# Patient Record
Sex: Male | Born: 1955 | Race: White | Hispanic: No | Marital: Married | Smoking: Former smoker
Health system: Southern US, Community
[De-identification: ages and names within clinical notes are randomized; demographics above are authoritative.]

## PROBLEM LIST (undated history)

## (undated) DIAGNOSIS — I2699 Other pulmonary embolism without acute cor pulmonale: Secondary | ICD-10-CM

## (undated) DIAGNOSIS — J189 Pneumonia, unspecified organism: Secondary | ICD-10-CM

## (undated) DIAGNOSIS — E079 Disorder of thyroid, unspecified: Secondary | ICD-10-CM

## (undated) DIAGNOSIS — I10 Essential (primary) hypertension: Secondary | ICD-10-CM

## (undated) DIAGNOSIS — I82401 Acute embolism and thrombosis of unspecified deep veins of right lower extremity: Secondary | ICD-10-CM

## (undated) DIAGNOSIS — I4891 Unspecified atrial fibrillation: Secondary | ICD-10-CM

## (undated) DIAGNOSIS — I499 Cardiac arrhythmia, unspecified: Secondary | ICD-10-CM

## (undated) DIAGNOSIS — E039 Hypothyroidism, unspecified: Secondary | ICD-10-CM

## (undated) DIAGNOSIS — M1990 Unspecified osteoarthritis, unspecified site: Secondary | ICD-10-CM

## (undated) HISTORY — DX: Disorder of thyroid, unspecified: E07.9

## (undated) HISTORY — DX: Essential (primary) hypertension: I10

## (undated) HISTORY — PX: ANKLE FUSION: SHX881

## (undated) HISTORY — PX: PARTIAL HIP ARTHROPLASTY: SHX733

## (undated) HISTORY — PX: TOTAL HIP ARTHROPLASTY: SHX124

## (undated) HISTORY — PX: HERNIA REPAIR: SHX51

---

## 2004-12-19 ENCOUNTER — Inpatient Hospital Stay (HOSPITAL_COMMUNITY): Admission: RE | Admit: 2004-12-19 | Discharge: 2004-12-22 | Payer: Self-pay | Admitting: Orthopedic Surgery

## 2021-05-10 ENCOUNTER — Other Ambulatory Visit: Payer: Self-pay | Admitting: Surgery

## 2021-05-10 ENCOUNTER — Other Ambulatory Visit (HOSPITAL_COMMUNITY): Payer: Self-pay | Admitting: Surgery

## 2021-05-25 ENCOUNTER — Ambulatory Visit (HOSPITAL_COMMUNITY)
Admission: RE | Admit: 2021-05-25 | Discharge: 2021-05-25 | Disposition: A | Discharge status: Home / Self Care | Payer: 59 | Source: Ambulatory Visit | Attending: Surgery | Admitting: Surgery

## 2021-05-25 ENCOUNTER — Other Ambulatory Visit: Payer: Self-pay

## 2021-08-03 ENCOUNTER — Other Ambulatory Visit: Payer: Self-pay

## 2021-08-03 ENCOUNTER — Encounter: Payer: Self-pay | Admitting: Skilled Nursing Facility1

## 2021-08-03 ENCOUNTER — Encounter: Payer: 59 | Attending: Surgery | Admitting: Skilled Nursing Facility1

## 2021-08-03 DIAGNOSIS — E669 Obesity, unspecified: Secondary | ICD-10-CM | POA: Diagnosis present

## 2021-08-03 NOTE — Progress Notes (Signed)
Nutrition Assessment for Bariatric Surgery Medical Nutrition Therapy Appt Start Time: 9:55    End Time: 11:00  Patient was seen on 08/03/2021 for Pre-Operative Nutrition Assessment. Letter of approval faxed to [facility] bariatric surgery program coordinator on 08/03/2021.   Referral stated Supervised Weight Loss (SWL) visits needed: 0  Pt completed visits.   Pt has cleared nutrition requirements.   Planned surgery: sleeve gastrectomy  Pt expectation of surgery: to lose weight Pt expectation of dietitian: to help educate    NUTRITION ASSESSMENT   Anthropometrics  Start weight at [facility]: 330.5 lbs (date: 08/03/2021)  Height: 70 in BMI: 47.42 kg/m2     Clinical  Medical hx: HTN, thyroid Medications: see list  Labs: LDL 130, calcium 10.3 Notable signs/symptoms: aches and pains Any previous deficiencies? No  Micronutrient Nutrition Focused Physical Exam: Hair: No issues observed Eyes: No issues observed Mouth: No issues observed Neck: No issues observed Nails: No issues observed Skin: No issues observed  Lifestyle & Dietary Hx  Pt states he is excited to retire in the next 2 years.  Pt states he is in the middle of setting up a camping spot for his grandkids.  Pt states his wife makes the meals which are no healthy so he focuses on eating bars mostly: Dietitian educated pt on this topic.  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   24-Hr Dietary Recall First Meal 10am:  fruit bar and 2 halos Snack: energy bar Second Meal 2pm: tuna pack + crackers + energy bar Snack:  Third Meal 5:30-6: fruit bar  Snack:  Beverages: 80 ounces coffee, water 40-60 ounces, sweet tea   Estimated Energy Needs Calories: 1600   NUTRITION DIAGNOSIS  Overweight/obesity ([place]-3.3) related to past poor dietary habits and physical inactivity as evidenced by patient w/ planned sleeve gastrectomy surgery following dietary guidelines for continued  weight loss.    NUTRITION INTERVENTION  Nutrition counseling (C-1) and education (E-2) to facilitate bariatric surgery goals.  Educated pt on micronutrient deficiencies post surgery and strategies to mitigate that risk   Pre-Op Goals Reviewed with the Patient Track food and beverage intake (pen and paper, MyFitness Pal, Baritastic app, etc.) Make healthy food choices while monitoring portion sizes Consume 3 meals per day or try to eat every 3-5 hours Avoid concentrated sugars and fried foods Keep sugar & fat in the single digits per serving on food labels Practice CHEWING your food (aim for applesauce consistency) Practice not drinking 15 minutes before, during, and 30 minutes after each meal and snack Avoid all carbonated beverages (ex: soda, sparkling beverages)  Limit caffeinated beverages (ex: coffee, tea, energy drinks) Avoid all sugar-sweetened beverages (ex: regular soda, sports drinks)  Avoid alcohol  Aim for 64-100 ounces of FLUID daily (with at least half of fluid intake being plain water)  Aim for at least 60-80 grams of PROTEIN daily Look for a liquid protein source that contains ?15 g protein and ?5 g carbohydrate (ex: shakes, drinks, shots) Make a list of non-food related activities Physical activity is an important part of a healthy lifestyle so keep it moving! The goal is to reach 150 minutes of exercise per week, including cardiovascular and weight baring activity.  *Goals that are bolded indicate the pt would like to start working towards these  Handouts Provided Include  Bariatric Surgery handouts (Nutrition Visits, Pre-Op Goals, Protein Shakes, Vitamins & Minerals)  Learning Style & Readiness for Change Teaching method utilized: Visual &  Auditory  Demonstrated degree of understanding via: Teach Back  Readiness Level: action Barriers to learning/adherence to lifestyle change: none identified      MONITORING & EVALUATION Dietary intake, weekly physical  activity, body weight, and pre-op goals reached at next nutrition visit.    Next Steps  Patient is to follow up at [facility] for Pre-Op Class >2 weeks before surgery for further nutrition education.  Pt has completed visits. No further supervised visits required/recomended

## 2021-09-09 ENCOUNTER — Ambulatory Visit (INDEPENDENT_AMBULATORY_CARE_PROVIDER_SITE_OTHER): Payer: 59 | Admitting: Psychology

## 2021-09-09 DIAGNOSIS — F509 Eating disorder, unspecified: Secondary | ICD-10-CM

## 2021-09-09 NOTE — Progress Notes (Addendum)
Date of Evaluation: 09/09/2021 Time Seen: 2pm Duration: 60 minutes Type of Session: Inittial Appointment for Evaluation  Location of Patient: Home (private location) Location of Provider: At home in a private office due to COVID-19 pandemic Type of Contact: Caregility video visit with audio  Prior to proceeding with today's appointment, two pieces of identifying information were obtained from [place] to verify identity. In addition, Emidio's physical location at the time of this appointment was obtained. In the event of technical difficulties, Sigmond shared a phone number they could be reached at. [place] and this provider participated in today's telepsychological service. Emre denied anyone else being present in the room or on the virtual appointment.The provider's role was explained to [place]. The provider reviewed and discussed issues of confidentiality, privacy, and limits therein (e.g., reporting obligations). In addition to verbal informed consent, written informed consent for psychological services was obtained from [place] prior to the initial intake interview. Written consent included information concerning the practice, financial arrangements, and confidentiality and patients' rights. Since the clinic is not a 24/7 crisis center, mental health emergency resources were shared, and the provider explained MyChart, e-mail, voicemail, and/or other messaging systems should be utilized only for non-emergency reasons. This provider also explained that information obtained during appointments will be placed in their electronic medical chart in a confidential manner. Rondy verbally acknowledged understanding of the aforementioned and agreed to use mental health emergency resources discussed if needed. Moreover, Asiel agreed information may be shared with other [place] [facility] providers as needed for coordination of care. By signing the new patient documents, Mcgwire provided written consent for  coordination of care. Benson verbally acknowledged understanding he is ultimately responsible for understanding his insurance benefits as it relates to reimbursement of telepsychological and in-person services. This provider also reviewed confidentiality, as it relates to telepsychological services, as well as the rationale for telepsychological services. More specifically, this provider's clinic is providing telepsychological services to reduce exposure to COVID-19. The patient expressed understanding regarding the rationale for telepsychological services. Kalen verbally consented to proceed.  Coyle completed the psychiatric diagnostic evaluation (complete history, including past, family, and social history, psychiatric history, and establishment of a tentative diagnosis) and the bariatric assessment for a total of 60 minutes. Code [phone] was billed.   Additionally, during this initial appointment this provider completed the scoring of the following measures: Beck Anxiety Inventory (BAI) (5 minutes), Becks Depression Index (5 minutes), Eating Disorder Diagnostic Scale (EDDS) (10 minutes [administration and scoring]), Mini Mental Status Examination (MMSE) (10 minutes [administration and scoring]), & Mood Disorder Questionnaire (MDQ) (5 minutes [administration and scoring]). A total of 35 minutes was spent on the scoring of the aforementioned measures and code [id number] was billed.   Mental Status Examination:  Appearance:  neat Behavior: appropriate to circumstances Mood: euthymic Affect: mood congruent Speech: WNL Eye Contact: appropriate Psychomotor Activity: WNL Thought Process: linear, logical, and goal directed and denies suicidal, homicidal, and self-harm ideation, plan and intent Content/Perceptual Disturbances: none Orientation: AAOx4 Cognition/Sensorium: intact Insight: good Judgment: good  Time Requirements: Interview: 60 minutes (billing code [id number]) Assessment scoring and interpreting:  35 minutes (billing code [id number])  DSM-5 Diagnosis(es) Code: E66.01 Morbid Obesity  Plan: Donavyn is currently scheduled for a feedback appointment on 09/23/2021 at 2pm via [place] video visit with audio.      Bariatric Evaluation    CONFIDENTIAL    Client Name: Jetson Pickrel  MRN: [id number] Date of Birth: [birth year]                                                 Date of Evaluation: 09/09/2021 Total Assessment Time: 60 Minutes                                          Date of Report: 09/09/2021 Evaluator: Clarice Pole, Psy.D.                                 Referring Physician: Dr. Romana Juniper, [facility]  Reason for Referral: Mr. Elizabeth Haff reported he was referred for an evaluation to determine if he is mentally fit for [bariatric] surgery. Per referral paperwork, he has been struggling [with obesity] essentially his entire adult life, has had limited and transient success with multiple attempts at weight loss, is interested in sleeve gastrectomy in order to afford aggressive treatment of his obesity, and is an excellent candidate for sleeve gastrectomy.   Sources of Information Clinical Interview Bariatric Questionnaire [place] Interview for Gastric Bypass Beck Anxiety Inventory (BAI) Beck Depression Inventory, 2nd Edition (BDI-II) Eating Disorder Diagnostic Scale (EDDS) Mini-Mental State Examination (MMSE)  Mood Disorder Questionnaire (MDQ) Review of Medical Record (provided by [facility])  Patient Identification and Chief Complaint: Mr. Grantham currently resides in [place], [place], noting he lives with his wife whom he gets along with well. He stated he completed high school and a four-year program. He denied a history of learning diagnosis or grade retentions. Mr. Hohmann reported he is currently employed with [employer], which he enjoys.   Mr. Gassmann discussed a belief weight loss may help with increasing energy and flexibility,  feel[ing] better, and reducing use of medication. He shared his social support system consists of his wife and children. Mr. Doescher reported a belief there would be a positive impact on his relationships if he were to lose weight, adding it would better allow him to play with his grandchildren. He described his wife as the primary cook in the house.    Mr. Kosh and referral paperwork reported his medical history is significant for the following: arthritis and DVT. Mr. Coffin and referral paperwork noted his surgical history is significant for inguinal hernia, broken hip, hip replacement, and ankle fusion. His family medical history is significant for stroke, hypertension, and coronary artery disease. Mr. Saldivar reported he is medication compliant and does not have any issues with his current medications.   Current Medications: Amlodipine Valsartan 5-357m Gabapentin 300MG Levothyroxine 200MCG Meloxicam 7.5MG   Mr. CPanebiancodescribed his mental health history as remarkable. He denied ever utilizing mental health services; psychotropic medication; or meeting full criteria for a depressive episode, anxiety disorder, and panic attack. He also denied ever experiencing psychiatric hospitalization; abuse or neglect; or suicidal ideation, plan, or intent. He reported use of one standard size drink of alcohol a week and having ceased use of tobacco 31 years ago. He denied use of any other recreational and illicit substances and expressed confidence in his ability to cease alcohol and maintain his cessation of tobacco use. He denied awareness of any familial history of psychiatric concerns or substance abuse.  Patient's Understanding of the Procedure/Risks of Surgery: Mr. Mcclenathan reported having a friend and a neighbor that previously underwent bariatric surgery, noting both were successful. He denied having any concerns about information he has received from them. He shared he is pursuing a gastric sleevectomy,  stating it involves putting three holes in [his] belly and surgically removing a part of the stomach and leaving a two-ounce pouch stomach. He described awareness of anesthesia being administered during the procedure and noted the surgery can cause death, blood clots, trouble healing, infection, diarrhea, and nutritional deficiencies. He stated there would be a postoperative hospital stay and he would be unable to return to work/daily activities for two-to-three weeks. He reported his primary motivations for the procedure include increased day-to-day mobility, improved health, enhanced relationship with his wife. He expressed an expectation to lose 100 pounds following the surgery, noting maximum weight loss could take one-to-two years.  Mr. Mccready reported confidence in his ability to implement food restrictions following gastric surgery, noting he has been drinking protein shakes, taking multivitamins, researching the procedure and required dietary changes, following [facility]' dietary recommendations, increasing his water intake from 60oz to 70oz daily, using a diet tracker application, eating more consistently throughout the day, using a Fitbit to increase his walking to 10,000 steps a day, reducing his use of coffee, and has plans to replace sweet tea with unsweet tea. He expressed confidence in his ability to obtain and prepare food. He reported a belief his living environment would assist him in his attempts to control his eating. Following surgery, he reported awareness he will be primarily consuming liquids and portion sizes will be small. When able to start eating solid foods, he indicated he would have to consume lean meats (e.g., chicken), vegetables, and fruits. Mr. Niu expressed awareness he would have to avoid or limit his use of coffee, carbonated beverages, spicy foods, sugary foods and drinks, and red meat. Optimally, he indicated he would consume at least three small meals a day, chew his  food carefully, and not drink prior to meals and wait after meals to drink, and sip versus gulp beverages.   History of Weight Gains and Losses: Mr. Winterton described being fairly active, noting he is often building or making things. He described his past weight loss efforts as including an unspecified Diet Center in the 1980s (helpful), local diet food service with B12 (helpful), and Weight Watchers (helpful) in the 1980s and 1990s. He noted use of a Keto diet in recent years (not helpful) and current use of nutritional services (helpful).  Eating Behaviors: Mr. Howatt denied a history of binge- and purge-related behaviors. He reported taking multivitamins and minerals daily and drinking at least 60oz of water daily.   Current Diet and Plans for the Future: Breakfast Eggs and two slices of bread.  Lunch Tuna or sardines with crackers.   Dinner Ribs, casserole, or chicken.   Snacks If anything, potato chips.    Mental Status Examination: Mr. Merolla presented on time to the session and completed all the necessary paperwork. He was dressed casually, and his hygiene was observed to be good. Mr. Labrada was oriented to person, place, time, and purpose of appointment. His attitude was cooperative and cheerful. There were no unusual psychomotor movements or changes. Speech patterns were normal in rate, tone, volume, and without pressure. Affect was reactive and mood congruent. Thought processes were goal-directed and logical. Insight and judgement were good. The Mini-Mental State Examination (MMSE) was administered. He scored a 20/21 (  patient was experiencing technical issues that prevented administration of tasks with visual components), which is indicative of normal cognitive functioning.    Psychological Functioning: Mr. Rosenbloom completed the Mood Disorder Questionnaire (MDQ). He scored a 0/13 and denied several of the endorsed symptoms have occurred during the same period or have caused any  problems, which is a negative screening for bipolar-related disorder. On the [place] (BAI), Mr. Carlis Abbott scored a 5/63, which is indicative of minimal anxiety symptomatology. On the Beck Depression Inventory (BDI-II), Mr. Tritschler scored an 8/63, which is indicative of minimal depression symptomatology. The Eating Disorder Diagnostic Scale (EDDS) was administered. Mr. Guardado indicated his weight and shape have impacted how he views himself as a person. He denied experiencing any problematic eating behaviors.    Conclusions & Recommendations: Mr. Aric Jost is a 66 year old male who was referred to the [place] division by Dr. Romana Juniper at [facility] for a psychological evaluation to determine his suitability for bariatric surgery.   Regarding bariatric surgery, Mr. Karner appears to be highly motivated and has a good understanding of the bariatric surgery as well as risks and lifestyle changes needed to promote post-surgical weight loss success. Results of this evaluation yielded use of one standard drink of alcohol a week and occasional inappropriate food choices. He described awareness of the importance of ceasing his use of alcohol and following all dietary requirements, discussing confidence in his ability to do so and various successful efforts he has made. At this time, Mr. Bures appears to be able to make an informed decision about the surgery he is contemplating. He appears to be motivated and expressed understanding of the post-surgical requirements. If Mr. Claggett surgery is scheduled more than three months from today's date (09/09/2021), he is required to schedule a follow-up visit for this examiner to briefly re-evaluate his psychological status at that time. This follow-up visit should occur within two months of his scheduled surgery date.  The following recommendations are offered to promote Mr. Lautner health and well-being:  It is  recommended Mr. Etheredge participate in educational sessions regarding a healthy diet and post-operative meal planning with a dietician or other health care providers.   Re-evaluation. If Mr. Senner surgery is scheduled more than three months from his date of approval, he is required to contact our office [phone]) for a brief check-in appointment within two months from his surgery date.   Nutritional Counseling. Mr. Chesnut is strongly encouraged to continue attending nutritional counseling appointments in order to plan a healthy diet and post-operative meals. He is encouraged to make recommended changes to his diet prior to surgery in order to increase the chances of continuing a healthy diet after surgery.   Exercise. Mr. Qualley is encouraged to participate in educational sessions on exercise that will be appropriate for his medical conditions and support his weight loss plans in a safe and healthy manner. Specifically, he is encouraged to consider participating in the Bariatric Exercise and Lifestyle Transformation (BELT) program, a partnership between [place]. There, he will join fellow [facility] bariatric patients three times a week for personalized aerobic and strength training instruction, as well as educational sessions on diet, exercise and behavior modification strategies. BELT meets at [place] at [employer].  LegalPaid.ch  Support groups. Mr. Slone is encouraged to join a support group to give him encouragement as he faces the psychological adjustments of bariatric surgery and the need to significantly  adjust one's meals and food choices. A list of support groups offered through [facility] can be found through the bariatrics department website: MetroMeds.nl  Self-help resources.  To develop strategies for managing  emotional difficulties encountered before and after weight loss surgery, the patient is encouraged to read The Emotional First + Aid Kit: A Practical Guide to Life After Bariatric Surgery, Second Edition by Caren Griffins L. Sheppard Coil, PsyD. Examples of strategies discussed in this book include relieving stress without using food, developing and maintaining an exercise program, preventing relapse, etc. Mr. Suydam is strongly encouraged to practice mindful eating, the goal of which is to pay close attention to the smell, sight, taste, temperature, texture, etc. of food. Eating mindfully helps to eat slower while enjoying food more fully. Useful books on mindful eating include Savor: Mindful Eating, Mindful Life and How to Eat, both by mindfulness expert Thich Nhat Hanh.  Mental health treatment. For additional support either prior to or after the surgery, Mr. Ahonen may consider seeking the care of a therapist (counselor, psychologist) in order to develop skills for coping with the adjustment to a new lifestyle. Available therapists include other clinicians within the [place] [phone]). He may also seek other in-network providers in the community by searching online at DustingSprays.pl.   General recommendations for bariatric surgery: Replace the habit of late-night snacking with something else (e.g., chewing gum, drinking water, or a relaxing activity like reading or crosswords that occupies your hands) and consider going to bed earlier.  Practice eating 4-6 meals per day. Each meal should last about 20 minutes. Practice drinking liquids 30 minutes before or after meals. Keep a food diary for 1 week. Record all foods eaten during the day, including snacks and drinks. Be very specific and very honest.  Get into the habit of reading food labels to evaluate content of protein, sugars, carbohydrates, sodium, etc.  Continue to eat lots of vegetables.  Prepare meals at home,  rather than take-out or fast food.  Take multivitamins including zinc and iron.  Develop exercise plans, including a low-impact and safe exercise plan to start 4-5 weeks into recovery, and a more intensive exercise plan for later.  Determine who will take care of any major responsibilities (particularly those involving physical activity, such as childcare) in the early stages of your recovery.  Educate family and friends who will be involved in your recovery about the extent and importance of your new lifestyle changes. The more they know, the better they can support you and help you stay on track!               Dolores Lory, PsyD

## 2021-09-23 ENCOUNTER — Ambulatory Visit (INDEPENDENT_AMBULATORY_CARE_PROVIDER_SITE_OTHER): Payer: 59 | Admitting: Psychology

## 2021-09-23 NOTE — Progress Notes (Signed)
Date of Appointment: 09/23/2021  Time Seen: 2pm Duration: 30 minutes (20 minutes feedback and 10 minutes report writing) Type of Session: Follow-up Appointment for Evaluation  Location of Patient: Home (private location) Location of Provider: At home in a private office due to COVID-19 pandemic Type of Contact: Caregility video visit with audio  Ethaniel participated in the session via Caregility video visit with audio, from the privacy of their home due to the COVID-19 pandemic. Danzel provided verbal consent to proceed with today's appointment. This provider participated from a private home office. Today's interactive feedback session was completed which included reviewing the following measures: Beck Anxiety Inventory (BAI), Becks Depression Index (BDI), Eating Disorder Diagnostic Scale (EDDS), Mental Status Examination (MSE), & Mood Disorder Questionnaire (MDQ). During this interactive feedback session, this provider and Corrigan discussed the results of the aforementioned measures, treatment recommendations, and the final determination regarding the psychological approval for bariatric surgery.   Please see the bariatric assessment, under documents, for additional details. This provider completed the written report which includes integration of patient data, interpretation of standardized test results, interpretation of clinical data, review of referral from surgeon & clinical decision making (105 minutes in total).  The interactive feedback session was completed today and a total of 30 minutes was spent on feedback and report writing. No billing code for the feedback appointment.   Mental Status Examination:  Appearance:  neat Behavior: appropriate to circumstances Mood: euthymic Affect: mood congruent Speech: WNL Eye Contact: appropriate Psychomotor Activity: WNL Thought Process: linear, logical, and goal directed and denies suicidal, homicidal, and self-harm ideation, plan and  intent Content/Perceptual Disturbances: none Orientation: AAOx4 Cognition/Sensorium: intact Insight: good Judgment: good  Time Requirements: Feedback: 30 total minutes (no billing code) Report writing: 105 minutes (billing codes 29798 and [phone])  DSM-5 Diagnosis(es) code: E66.01 Morbid Obesity  Plan: Aikeem provided verbal consent for his evaluation to be sent to [facility]. No further follow-up planned by this provider.                    Margarite Gouge, PsyD

## 2021-09-29 ENCOUNTER — Other Ambulatory Visit: Payer: Self-pay

## 2021-09-29 ENCOUNTER — Encounter: Payer: 59 | Attending: Surgery | Admitting: Skilled Nursing Facility1

## 2021-09-29 DIAGNOSIS — E669 Obesity, unspecified: Secondary | ICD-10-CM | POA: Diagnosis not present

## 2021-09-29 NOTE — Progress Notes (Signed)
Supervised Weight Loss Visit Bariatric Nutrition Education  1 out of 2 SWL Appointments   Sleeve gastrectomy   NUTRITION ASSESSMENT  Anthropometrics  Start weight at [facility]: 330.5 lbs (date: 08/03/2022) Today's weight: 327 lbs BMI: 46.92 kg/m2    Clinical  Medical hx: HTN, thyroid Medications: see list  Labs: LDL 130, calcium 10.3 Notable signs/symptoms: aches and pains Any previous deficiencies? No  Lifestyle & Dietary Hx  Pt states he has been Walking 4000-10000 steps per day.  Pt states he has been taking the bariatric multivitamin: advised to keep them but stop taking them until his surgery.   Pt states he needs to build a a fence and a bridge for some goats he owns 19 acres Pt states raw vegetables set his wife's crohns off so he does not eat non starchy vegetables.   Estimated daily fluid intake: 50-60 oz Supplements: bariatric multivitamin and calcium Current average weekly physical activity: working his 19 acres  24-Hr Dietary Recall First Meal: coffee + whey protein or yogurt Snack: 2 oranges  Second Meal: 2 slices pizza or energy bar Snack: crackers + sardines  Third Meal: 3 slices pizza  Snack:  Beverages: 3 cups of coffee, juice, water  Estimated Energy Needs Calories: 1800   NUTRITION DIAGNOSIS  Overweight/obesity ([place]-3.3) related to past poor dietary habits and physical inactivity as evidenced by patient w/ planned sleeve gastrectomy surgery following dietary guidelines for continued weight loss.   NUTRITION INTERVENTION  Nutrition counseling (C-1) and education (E-2) to facilitate bariatric surgery goals.  Pre-Op Goals Progress & New Goals NEW: add in non starchy vegetables   Handouts Provided Include    Learning Style & Readiness for Change Teaching method utilized: Visual & Auditory  Demonstrated degree of understanding via: Teach Back  Readiness Level: action Barriers to learning/adherence to lifestyle change: none identified   RD's  Notes for next Visit  Assess pts adherence to chosen goals   MONITORING & EVALUATION Dietary intake, weekly physical activity, body weight, and pre-op goals in 1 month.   Next Steps  Patient is to return to [facility] for next SWL

## 2021-10-17 ENCOUNTER — Encounter: Payer: 59 | Attending: Surgery | Admitting: Skilled Nursing Facility1

## 2021-10-17 ENCOUNTER — Other Ambulatory Visit: Payer: Self-pay

## 2021-10-17 DIAGNOSIS — E669 Obesity, unspecified: Secondary | ICD-10-CM | POA: Diagnosis not present

## 2021-10-17 NOTE — Progress Notes (Signed)
Supervised Weight Loss Visit ?Bariatric Nutrition Education ? ?2 out of 3 SWL Appointments  ? ?Sleeve gastrectomy  ? ?NUTRITION ASSESSMENT ? ?Anthropometrics  ?Start weight at [facility]: 330.5 lbs (date: 08/03/2022) ?Today's weight: 329 lbs ?BMI: 47.21 kg/m2   ? ?Clinical  ?Medical hx: HTN, thyroid ?Medications: see list  ?Labs: LDL 130, calcium 10.3 ?Notable signs/symptoms: aches and pains ?Any previous deficiencies? No ? ?Lifestyle & Dietary Hx ? ? ?Pt states he talked to his insurance provider which dictates he needs 3 SWL including his assessment and states today is his last visit: Dietitian advised pt this office only knows of the information sent over from the surgeons office.  ?Pt continued to vent about the money he has put into the program and does not wish to have to pay these fees again: Dietitian advised pt this conversation is outside of the scope of this office; pt states he is going to call his surgeons office ? ?Pt states he continues to log his foods. ? ?Estimated daily fluid intake: 50-60 oz ?Supplements: bariatric multivitamin and calcium ?Current average weekly physical activity: working his 19 acres ? ?24-Hr Dietary Recall ?First Meal: coffee + whey protein or yogurt or 2 oranges + yogurt + cup ?Snack: 2 oranges  ?Second Meal: 2 slices pizza or energy bar or energy bar + saltines + baked beans  ?Snack: crackers + sardines  ?Third Meal: 3 slices pizza or 2 pizza or chips and hamburger ?Snack:  ?Beverages: 3 cups of coffee, water ? ?Estimated Energy Needs ?Calories: 1800 ? ? ?NUTRITION DIAGNOSIS  ?Overweight/obesity ([place]-3.3) related to past poor dietary habits and physical inactivity as evidenced by patient w/ planned sleeve gastrectomy surgery following dietary guidelines for continued weight loss. ? ? ?NUTRITION INTERVENTION  ?Nutrition counseling (C-1) and education (E-2) to facilitate bariatric surgery goals. ? ?Pre-Op Goals Progress & New Goals ?continue: add in non starchy vegetables  ?NEW: avoid  drinking with meals  ? ?Handouts Provided Include  ? ? ?Learning Style & Readiness for Change ?Teaching method utilized: Visual & Auditory  ?Demonstrated degree of understanding via: Teach Back  ?Readiness Level: action ?Barriers to learning/adherence to lifestyle change: none identified  ? ?RD's Notes for next Visit  ?Assess pts adherence to chosen goals ? ? ?MONITORING & EVALUATION ?Dietary intake, weekly physical activity, body weight, and pre-op goals  ? ?Next Steps  ?Patient is to return to [facility] for next SWL ?

## 2021-10-27 ENCOUNTER — Ambulatory Visit: Payer: 59 | Admitting: Skilled Nursing Facility1

## 2021-10-31 ENCOUNTER — Ambulatory Visit: Payer: Self-pay | Admitting: Surgery

## 2021-10-31 NOTE — H&P (View-Only) (Signed)
?Brett Gibbs ?[id number] ?Referring Provider: [facility] * ? ?Subjective  ? ?Chief Complaint: New Consultation (Return Weight Loss - Sleeve - surgery date 11/28/2021) ? ? ?History of Present Illness: ? ?Returns for follow-up regarding surgical management of morbid obesity. He has completed the bariatric pathway with no barriers identified. His upper GI and chest x-ray were negative for mild esophageal fold thickening. He has been cleared by the dietitian and psychologist. His labs were unremarkable. He has continue to work diligently to increase his activity level and maintain a healthy eating pattern. ? ?Initial visit 05/06/21: This is a very pleasant 66 year old man who presents for consultation regarding surgical management of morbid obesity. He has been struggling with this for essentially his entire adult life, and has had limited and transient success with multiple attempts at weight loss through diet and exercise. He had COVID in the last couple of years and after that it seemed that his weight gain worsened and he has had increasing difficulty with weight loss. He also struggles with arthritic pain in his ankle, now his hip and shoulder, which he notes is aggravated by his weight. He is interested in sleeve gastrectomy in order to afford aggressive treatment of his obesity, to improve the quality and quantity of his life, as he has 3 grandchildren that he is wanting to spend time with and be active with. 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 He reports that at least on workdays, his eating habits are fairly well-balanced. ?He denies any significant cardiac or pulmonary history, but notes that he did develop the perioperative DVT after his ankle surgery and was on a limited course of Eliquis. That surgery was also complicated by postoperative UTI and  pneumonia. Reports history of hypertension and hypothyroid which are well managed. States that there is a condition in his family of the name of which he does not recall, but his 52-year-old grandson is 36 pounds and is followed by provider at [facility]. ?Previous abdominal surgery includes inguinal hernia in his 63s ? ?Review of Systems: ?A complete review of systems was obtained from the patient. I have reviewed this information and discussed as appropriate with the patient. See HPI as well for other ROS. ? ?Medical History: ?Past Medical History:  ?Diagnosis Date  ? Arthritis  ? DVT (deep venous thrombosis) (CMS-HCC)  ? ?Patient Active Problem List  ?Diagnosis  ? Acute right ankle pain  ? Osteoarthritis of right subtalar joint  ? Post-traumatic arthritis of right ankle  ? Primary osteoarthritis of right ankle  ? Pulmonary embolism, bilateral (CMS-HCC)  ? ?Past Surgical History:  ?Procedure Laterality Date  ? FUSION ARTHRODESIS ANKLE W/ARTHROSCOPY  ? Partial Hip Surgery  ? ? ?Allergies  ?Allergen Reactions  ? Penicillins Hives and Itching  ? ?Current Outpatient Medications on File Prior to Visit  ?Medication Sig Dispense Refill  ? amlodipine-valsartan (EXFORGE) 5-320 mg per tablet Please hold until PCP follow-up. Previous dose take one tablet by mouth daily.  ? gabapentin (NEURONTIN) 300 MG capsule 1 (ONE) CAPSULE EVERY 8 HOURS AS NEEDED FOR PAIN/SCIATICA  ? levothyroxine (SYNTHROID) 200 MCG tablet Take 200 mcg by mouth once daily  ? meloxicam (MOBIC) 7.5 MG tablet TAKE 1 TABLET BY MOUTH TWICE DAILY AS NEEDED FOR JOINT PAIN.  ? multivitamin tablet Take 1 tablet by mouth once  daily  ? ?No current facility-administered medications on file prior to visit.  ? ?Family History  ?Problem Relation Age of Onset  ? Stroke Mother  ? High blood pressure (Hypertension) Mother  ? High blood pressure (Hypertension) Father  ? Coronary Artery Disease (Blocked arteries around heart) Father  ? ? ?Social History  ? ?Tobacco Use  ?Smoking  Status Never  ?Smokeless Tobacco Never  ? ? ?Social History  ? ?Socioeconomic History  ? Marital status: Married  ?Tobacco Use  ? Smoking status: Never  ? Smokeless tobacco: Never  ?Vaping Use  ? Vaping Use: Unknown  ?Substance and Sexual Activity  ? Alcohol use: Defer  ? Drug use: Defer  ? ?Objective:  ? ?Vitals:  ?10/27/21 1103  ?BP: (!) 140/80  ?Pulse: 88  ?Temp: 36.3 ?C (97.4 ?F)  ?SpO2: 96%  ?Weight: (!) 148.5 kg (327 lb 6.4 oz)  ?Height: 177.8 cm (5\' 10" )  ? ?Body mass index is 46.98 kg/m?. ? ?Alert, well-appearing ?Unlabored respirations ? ?Assessment and Plan:  ?Diagnoses and all orders for this visit: ? ?Morbid obesity (CMS-HCC) ? ? ? ?He remains an excellent candidate for sleeve gastrectomy. We previously discussed the surgery including technical aspects, the risks of bleeding, infection, pain, scarring, injury to intra-abdominal structures, staple line leak or abscess, chronic abdominal pain or nausea, new onset or worsened GERD, DVT/PE, pneumonia, heart attack, stroke, death, failure to reach weight loss goals and weight regain, hernia. Discussed again the typical peri-, and postoperative course. Discussed the importance of lifelong behavioral changes to combat the chronic and relapsing disease which is obesity. He had several insightful questions which were answered to his satisfaction. His goal is to lose at least 100 pounds and to reach a weight of 200 pounds or less. We will proceed with sleeve gastrectomy as scheduled next month. ? ?Jerri Hargadon Raquel James, MD  ?

## 2021-10-31 NOTE — H&P (Signed)
?Brett Gibbs ?[id number]  ? ?Referring Provider: P.A., [facility] * ? ?Subjective  ? ?Chief Complaint: New Consultation (Return Weight Loss - Sleeve - surgery date 11/28/2021) ? ? ?History of Present Illness: ? ?Returns for follow-up regarding surgical management of morbid obesity. He has completed the bariatric pathway with no barriers identified. His upper GI and chest x-ray were negative for mild esophageal fold thickening. He has been cleared by the dietitian and psychologist. His labs were unremarkable. He has continue to work diligently to increase his activity level and maintain a healthy eating pattern. ? ?Initial visit 05/06/21: This is a very pleasant 66-year-old man who presents for consultation regarding surgical management of morbid obesity. He has been struggling with this for essentially his entire adult life, and has had limited and transient success with multiple attempts at weight loss through diet and exercise. He had COVID in the last couple of years and after that it seemed that his weight gain worsened and he has had increasing difficulty with weight loss. He also struggles with arthritic pain in his ankle, now his hip and shoulder, which he notes is aggravated by his weight. He is interested in sleeve gastrectomy in order to afford aggressive treatment of his obesity, to improve the quality and quantity of his life, as he has 3 grandchildren that he is wanting to spend time with and be active with. 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 He reports that at least on workdays, his eating habits are fairly well-balanced. ?He denies any significant cardiac or pulmonary history, but notes that he did develop the perioperative DVT after his ankle surgery and was on a limited course of Eliquis. That surgery was also complicated by postoperative UTI and  pneumonia. Reports history of hypertension and hypothyroid which are well managed. States that there is a condition in his family of the name of which he does not recall, but his 3-year-old grandson is 90 pounds and is followed by provider at [facility]. ?Previous abdominal surgery includes inguinal hernia in his 20s ? ?Review of Systems: ?A complete review of systems was obtained from the patient. I have reviewed this information and discussed as appropriate with the patient. See HPI as well for other ROS. ? ?Medical History: ?Past Medical History:  ?Diagnosis Date  ? Arthritis  ? DVT (deep venous thrombosis) (CMS-HCC)  ? ?Patient Active Problem List  ?Diagnosis  ? Acute right ankle pain  ? Osteoarthritis of right subtalar joint  ? Post-traumatic arthritis of right ankle  ? Primary osteoarthritis of right ankle  ? Pulmonary embolism, bilateral (CMS-HCC)  ? ?Past Surgical History:  ?Procedure Laterality Date  ? FUSION ARTHRODESIS ANKLE W/ARTHROSCOPY  ? Partial Hip Surgery  ? ? ?Allergies  ?Allergen Reactions  ? Penicillins Hives and Itching  ? ?Current Outpatient Medications on File Prior to Visit  ?Medication Sig Dispense Refill  ? amlodipine-valsartan (EXFORGE) 5-320 mg per tablet Please hold until PCP follow-up. Previous dose take one tablet by mouth daily.  ? gabapentin (NEURONTIN) 300 MG capsule 1 (ONE) CAPSULE EVERY 8 HOURS AS NEEDED FOR PAIN/SCIATICA  ? levothyroxine (SYNTHROID) 200 MCG tablet Take 200 mcg by mouth once daily  ? meloxicam (MOBIC) 7.5 MG tablet TAKE 1 TABLET BY MOUTH TWICE DAILY AS NEEDED FOR JOINT PAIN.  ? multivitamin tablet Take 1 tablet by mouth once   daily  ? ?No current facility-administered medications on file prior to visit.  ? ?Family History  ?Problem Relation Age of Onset  ? Stroke Mother  ? High blood pressure (Hypertension) Mother  ? High blood pressure (Hypertension) Father  ? Coronary Artery Disease (Blocked arteries around heart) Father  ? ? ?Social History  ? ?Tobacco Use  ?Smoking  Status Never  ?Smokeless Tobacco Never  ? ? ?Social History  ? ?Socioeconomic History  ? Marital status: Married  ?Tobacco Use  ? Smoking status: Never  ? Smokeless tobacco: Never  ?Vaping Use  ? Vaping Use: Unknown  ?Substance and Sexual Activity  ? Alcohol use: Defer  ? Drug use: Defer  ? ?Objective:  ? ?Vitals:  ?10/27/21 1103  ?BP: (!) 140/80  ?Pulse: 88  ?Temp: 36.3 ?C (97.4 ?F)  ?SpO2: 96%  ?Weight: (!) 148.5 kg (327 lb 6.4 oz)  ?Height: 177.8 cm (5' 10")  ? ?Body mass index is 46.98 kg/m?. ? ?Alert, well-appearing ?Unlabored respirations ? ?Assessment and Plan:  ?Diagnoses and all orders for this visit: ? ?Morbid obesity (CMS-HCC) ? ? ? ?He remains an excellent candidate for sleeve gastrectomy. We previously discussed the surgery including technical aspects, the risks of bleeding, infection, pain, scarring, injury to intra-abdominal structures, staple line leak or abscess, chronic abdominal pain or nausea, new onset or worsened GERD, DVT/PE, pneumonia, heart attack, stroke, death, failure to reach weight loss goals and weight regain, hernia. Discussed again the typical peri-, and postoperative course. Discussed the importance of lifelong behavioral changes to combat the chronic and relapsing disease which is obesity. He had several insightful questions which were answered to his satisfaction. His goal is to lose at least 100 pounds and to reach a weight of 200 pounds or less. We will proceed with sleeve gastrectomy as scheduled next month. ? ?Anjenette Gerbino AMANDA Navy Belay, MD  ?

## 2021-11-14 ENCOUNTER — Ambulatory Visit: Payer: 59 | Admitting: Skilled Nursing Facility1

## 2021-11-14 ENCOUNTER — Encounter: Payer: 59 | Attending: Surgery | Admitting: Skilled Nursing Facility1

## 2021-11-14 DIAGNOSIS — E669 Obesity, unspecified: Secondary | ICD-10-CM | POA: Diagnosis present

## 2021-11-14 DIAGNOSIS — Z713 Dietary counseling and surveillance: Secondary | ICD-10-CM | POA: Diagnosis not present

## 2021-11-14 NOTE — Progress Notes (Signed)
Pre-Operative Nutrition Class:   ? ?Patient was seen on 11/14/2021 for Pre-Operative Bariatric Surgery Education at [facility]   ? ?Surgery date: 11/28/2021 ?Surgery type: Sleeve ?Start weight at [facility]: 330.5 ?Weight today: 331.5 ? ?Samples given per MNT protocol. Patient educated on appropriate usage: ?Bariatric Advantage Multivitamin ?Lot #[id number] ?Exp:10/23 ?  ?Bariatric Advantage Calcium  ?Lot #[id number] ?Exp:06/19/2022 ?  ?Protein Shake Ensure Max ?Lot # [id number] ?Exp: 09/14/2022 ?  ?Bariatric Advantage Protein Powder ?Lot # [id number] ?Exp: 02/24 ? ?The following the learning objectives were met by the patient during this course: ?Identify Pre-Op Dietary Goals and will begin 2 weeks pre-operatively ?Identify appropriate sources of fluids and proteins  ?State protein recommendations and appropriate sources pre and post-operatively ?Identify Post-Operative Dietary Goals and will follow for 2 weeks post-operatively ?Identify appropriate multivitamin and calcium sources ?Describe the need for physical activity post-operatively and will follow MD recommendations ?State when to call healthcare provider regarding medication questions or post-operative complications ?When having a diagnosis of diabetes understanding hypoglycemia symptoms and the inclusion of 1 complex carbohydrate per meal ? ?Handouts given during class include: ?Pre-Op Bariatric Surgery Diet Handout ?Protein Shake Handout ?Post-Op Bariatric Surgery Nutrition Handout ?[facility] Program Information Flyer ?Support Group Information Flyer ?[facility] Bariatric Supplements Price List ? ?Follow-Up Plan: ?Patient will follow-up at [facility] 2 weeks post operatively for diet advancement per MD.  ?

## 2021-11-14 NOTE — Patient Instructions (Signed)
DUE TO COVID-19 ONLY ONE VISITOR  (aged 66 and older)  IS ALLOWED TO COME WITH YOU AND STAY IN THE WAITING ROOM ONLY DURING PRE OP AND PROCEDURE.   ?**NO VISITORS ARE ALLOWED IN THE SHORT STAY AREA OR RECOVERY ROOM!!** ? ?IF YOU WILL BE ADMITTED INTO THE HOSPITAL YOU ARE ALLOWED ONLY TWO SUPPORT PEOPLE DURING VISITATION HOURS ONLY (7 AM -8PM)   ?The support person(s) must pass our screening, gel in and out, and wear a mask at all times, including in the patient?s room. ?Patients must also wear a mask when staff or their support person are in the room. ?Visitors GUEST BADGE MUST BE WORN VISIBLY  ?One adult visitor may remain with you overnight and MUST be in the room by 8 P.M. ?  ? ? Your procedure is scheduled on: 11/28/21 ? ? [facility] ? ?  Report to admitting at : 8:45 AM ? ? Call this number if you have problems the morning of surgery [phone] ? ? Do not eat food :After Midnight. ? ? After Midnight you may have the following liquids until : 8:00 AM  DAY OF SURGERY ? ?MORNING OF SURGERY DRINK:   DRINK 1 G2 drink BEFORE YOU LEAVE HOME (At 8:00 AM), DRINK ALL OF THE  G2 DRINK AT ONE TIME.   ?NO SOLID FOOD AFTER 600 PM THE NIGHT BEFORE YOUR SURGERY. YOU MAY DRINK CLEAR FLUIDS. THE G2 DRINK YOU DRINK BEFORE YOU LEAVE HOME WILL BE THE LAST FLUIDS YOU DRINK BEFORE SURGERY. ? ?PAIN IS EXPECTED AFTER SURGERY AND WILL NOT BE COMPLETELY ELIMINATED. AMBULATION AND TYLENOL WILL HELP REDUCE INCISIONAL AND GAS PAIN. MOVEMENT IS KEY! ? ?YOU ARE EXPECTED TO BE OUT OF BED WITHIN 4 HOURS OF ADMISSION TO YOUR PATIENT ROOM. ? ?SITTING IN THE RECLINER THROUGHOUT THE DAY IS IMPORTANT FOR DRINKING FLUIDS AND MOVING GAS THROUGHOUT THE GI TRACT. ? ?COMPRESSION STOCKINGS SHOULD BE [facility]N [facility] ST[facility] UNLESS YOU ARE WALKING.  ? ?INCENTIVE SPIROMETER SHOULD BE USED EVERY HOUR WHILE AWAKE TO DECREASE POST-OPERATIVE COMPLICATIONS SUCH AS PNEUMONIA. ? ?WHEN DISCHARGED HOME, IT IS IMPORTANT TO  CONTINUE TO WALK EVERY HOUR AND USE THE INCENTIVE SPIROMETER EVERY HOUR.  ? ?Water ?Black Coffee (sugar ok, NO MILK/CREAM OR CREAMERS)  ?Tea (sugar ok, NO MILK/CREAM OR CREAMERS) regular and decaf                             ?Plain Jell-O (NO RED)                                           ?Fruit ices (not with fruit pulp, NO RED)                                     ?Popsicles (NO RED)                                                                  ?Juice: apple, WHITE grape, WHITE cranberry ?Sports drinks like Gatorade (  NO RED) ?Clear broth(vegetable,chicken,beef) ?  ?  ?The day of surgery:  ?Drink ONE (1) Pre-Surgery Clear Ensure or G2 at AM the morning of surgery. Drink in one sitting. Do not sip.  ?This drink was given to you during your hospital  ?pre-op appointment visit. ?Nothing else to drink after completing the  ?Pre-Surgery Clear Ensure or G2. ?  ?       If you have questions, please contact your surgeon?s office.  ?  ?Oral Hygiene is also important to reduce your risk of infection.                                    ?Remember - BRUSH YOUR TEETH THE MORNING OF SURGERY WITH YOUR REGULAR TOOTHPASTE ? ? Do NOT smoke after Midnight ? ? Take these medicines the morning of surgery with A SIP OF WATER:  ? ?DO NOT TAKE ANY ORAL DIABETIC MEDICATIONS DAY OF YOUR SURGERY ? ?Bring CPAP mask and tubing day of surgery. ?                  ?           You may not have any metal on your body including hair pins, jewelry, and body piercing ? ?           Do not wear lotions, powders, perfumes/cologne, or deodorant ? ?            Men may shave face and neck. ? ? Do not bring valuables to the hospital. [facility] IS NOT ?            RESPONSIBLE   FOR VALUABLES. ? ? Contacts, dentures or bridgework may not be worn into surgery. ? ? Bring small overnight bag day of surgery. ?  ? Patients discharged on the day of surgery will not be allowed to drive home.  Someone NEEDS to stay with you for the first 24 hours after  anesthesia. ? ? Special Instructions: Bring a copy of your healthcare power of attorney and living will documents         the day of surgery if you haven't scanned them before. ? ?            Please read over the following fact sheets you were given: IF YOU HAVE QUESTIONS ABOUT YOUR PRE-OP INSTRUCTIONS PLEASE CALL [phone] ? ?   [facility] - Preparing for Surgery ?Before surgery, you can play an important role.  Because skin is not sterile, your skin needs to be as free of germs as possible.  You can reduce the number of germs on your skin by washing with CHG (chlorahexidine gluconate) soap before surgery.  CHG is an antiseptic cleaner which kills germs and bonds with the skin to continue killing germs even after washing. ?Please DO NOT use if you have an allergy to CHG or antibacterial soaps.  If your skin becomes reddened/irritated stop using the CHG and inform your nurse when you arrive at Short Stay. ?Do not shave (including legs and underarms) for at least 48 hours prior to the first CHG shower.  You may shave your face/neck. ?Please follow these instructions carefully: ? 1.  Shower with CHG Soap the night before surgery and the  morning of Surgery. ? 2.  If you choose to wash your hair, wash your hair first as usual with your  normal  shampoo. ? 3.  After you shampoo, rinse your hair and body thoroughly to remove the  shampoo.                           4.  Use CHG as you would any other liquid soap.  You can apply chg directly  to the skin and wash  ?                     Gently with a scrungie or clean washcloth. ? 5.  Apply the CHG Soap to your body ONLY FROM THE NECK DOWN.   Do not use on face/ open      ?                     Wound or open sores. Avoid contact with eyes, ears mouth and genitals (private parts).  ?                     Engineering geologist,  Genitals (private parts) with your normal soap. ?            6.  Wash thoroughly, paying special attention to the area where your surgery  will be  performed. ? 7.  Thoroughly rinse your body with warm water from the neck down. ? 8.  DO NOT shower/wash with your normal soap after using and rinsing off  the CHG Soap. ?               9.  Pat yourself dry with a clean towel. ?           10.  Wear clean pajamas. ?           11.  Place clean sheets on your bed the night of your first shower and do not  sleep with pets. ?Day of Surgery : ?Do not apply any lotions/deodorants the morning of surgery.  Please wear clean clothes to the hospital/surgery center. ? ?FAILURE TO FOLLOW THESE INSTRUCTIONS MAY RESULT IN THE CANCELLATION OF YOUR SURGERY ?PATIENT SIGNATURE_________________________________ ? ?NURSE SIGNATURE__________________________________ ? ?________________________________________________________________________  ? ?

## 2021-11-15 ENCOUNTER — Encounter (HOSPITAL_COMMUNITY): Payer: Self-pay

## 2021-11-15 ENCOUNTER — Encounter (HOSPITAL_COMMUNITY)
Admission: RE | Admit: 2021-11-15 | Discharge: 2021-11-15 | Disposition: A | Discharge status: Home / Self Care | Payer: 59 | Source: Ambulatory Visit | Attending: Surgery | Admitting: Surgery

## 2021-11-15 ENCOUNTER — Other Ambulatory Visit: Payer: Self-pay

## 2021-11-15 DIAGNOSIS — Z01812 Encounter for preprocedural laboratory examination: Secondary | ICD-10-CM | POA: Diagnosis present

## 2021-11-15 DIAGNOSIS — Z6841 Body Mass Index (BMI) 40.0 and over, adult: Secondary | ICD-10-CM | POA: Diagnosis not present

## 2021-11-15 HISTORY — DX: Unspecified osteoarthritis, unspecified site: M19.90

## 2021-11-15 HISTORY — DX: Unspecified atrial fibrillation: I48.91

## 2021-11-15 HISTORY — DX: Cardiac arrhythmia, unspecified: I49.9

## 2021-11-15 HISTORY — DX: Other pulmonary embolism without acute cor pulmonale: I26.99

## 2021-11-15 HISTORY — DX: Acute embolism and thrombosis of unspecified deep veins of right lower extremity: I82.401

## 2021-11-15 HISTORY — DX: Hypothyroidism, unspecified: E03.9

## 2021-11-15 HISTORY — DX: Pneumonia, unspecified organism: J18.9

## 2021-11-15 LAB — CBC WITH DIFFERENTIAL/PLATELET
Abs Immature Granulocytes: 0.01 10*3/uL (ref 0.00–0.07)
Basophils Absolute: 0.1 10*3/uL (ref 0.0–0.1)
Basophils Relative: 2 %
Eosinophils Absolute: 0.4 10*3/uL (ref 0.0–0.5)
Eosinophils Relative: 6 %
HCT: 45.2 % (ref 39.0–52.0)
Hemoglobin: 15.1 g/dL (ref 13.0–17.0)
Immature Granulocytes: 0 %
Lymphocytes Relative: 33 %
Lymphs Abs: 1.9 10*3/uL (ref 0.7–4.0)
MCH: 30.8 pg (ref 26.0–34.0)
MCHC: 33.4 g/dL (ref 30.0–36.0)
MCV: 92.2 fL (ref 80.0–100.0)
Monocytes Absolute: 0.4 10*3/uL (ref 0.1–1.0)
Monocytes Relative: 8 %
Neutro Abs: 3 10*3/uL (ref 1.7–7.7)
Neutrophils Relative %: 51 %
Platelets: 182 10*3/uL (ref 150–400)
RBC: 4.9 MIL/uL (ref 4.22–5.81)
RDW: 13 % (ref 11.5–15.5)
WBC: 5.7 10*3/uL (ref 4.0–10.5)
nRBC: 0 % (ref 0.0–0.2)

## 2021-11-15 LAB — COMPREHENSIVE METABOLIC PANEL
ALT: 30 U/L (ref 0–44)
AST: 23 U/L (ref 15–41)
Albumin: 3.9 g/dL (ref 3.5–5.0)
Alkaline Phosphatase: 54 U/L (ref 38–126)
Anion gap: 7 (ref 5–15)
BUN: 18 mg/dL (ref 8–23)
CO2: 29 mmol/L (ref 22–32)
Calcium: 10.4 mg/dL — ABNORMAL HIGH (ref 8.9–10.3)
Chloride: 104 mmol/L (ref 98–111)
Creatinine, Ser: 0.86 mg/dL (ref 0.61–1.24)
GFR, Estimated: >60 mL/min (ref >60)
Glucose, Bld: 110 mg/dL — ABNORMAL HIGH (ref 70–99)
Potassium: 4.6 mmol/L (ref 3.5–5.1)
Sodium: 140 mmol/L (ref 135–145)
Total Bilirubin: 0.8 mg/dL (ref 0.3–1.2)
Total Protein: 7 g/dL (ref 6.5–8.1)

## 2021-11-15 NOTE — Progress Notes (Addendum)
For Short Stay: ?[place] appointment date: ?Date of COVID positive in last 90 days: ?COVID Vaccine: Pfizer x 2: 04/30/20 ?Bowel Prep reminder: N/A ? ? ?For Anesthesia: ?PCP - Dr. Bertram Millard ?Cardiologist -  ? ?Chest x-ray - 05/25/21 ?EKG - 05/25/21 ?Stress Test -  ?ECHO -  ?Cardiac Cath -  ?Pacemaker/ICD device last checked: ?Pacemaker orders received: ?Device Rep notified: ? ?Spinal Cord Stimulator: ? ?Sleep Study -  ?CPAP -  ? ?Fasting Blood Sugar -  ?Checks Blood Sugar _____ times a day ?Date and result of last Hgb A1c- ? ?Blood Thinner Instructions: ?Aspirin Instructions: ?Last Dose: ? ?Activity level: Can go up a flight of stairs and activities of daily living without stopping and without chest pain and/or shortness of breath ?  Able to exercise without chest pain and/or shortness of breath ?  Unable to go up a flight of stairs without chest pain and/or shortness of breath ?   ? ?Anesthesia review: Hx: PE,blood clot,Afib ? ?Patient denies shortness of breath, fever, cough and chest pain at PAT appointment ? ? ?Patient verbalized understanding of instructions that were given to them at the PAT appointment. Patient was also instructed that they will need to review over the PAT instructions again at home before surgery.  ?

## 2021-11-15 NOTE — Progress Notes (Signed)
?   11/15/21 0853  ?OBSTRUCTIVE SLEEP APNEA  ?Have you ever been diagnosed with sleep apnea through a sleep study? No  ?Do you snore loudly (loud enough to be heard through closed doors)?  1  ?Do you often feel tired, fatigued, or sleepy during the daytime (such as falling asleep during driving or talking to someone)? 0  ?Has anyone observed you stop breathing during your sleep? 1  ?Do you have, or are you being treated for high blood pressure? 1  ?BMI more than 35 kg/m2? 1  ?Age > 50 (1-yes) 1  ?Neck circumference greater than:Male 16 inches or larger, Male 17inches or larger? 1  ?Male Gender (Yes=1) 1  ?Obstructive Sleep Apnea Score 7  ? ? ?

## 2021-11-15 NOTE — Progress Notes (Deleted)
?   11/15/21 1057  ?OBSTRUCTIVE SLEEP APNEA  ?Score 5 or greater  Results sent to PCP  ? ? ?

## 2021-11-16 LAB — TYPE AND SCREEN
ABO/RH(D): A NEG
Antibody Screen: NEGATIVE

## 2021-11-25 NOTE — Progress Notes (Signed)
Called patient and spoke with his wife, Brett Gibbs. She is instructed for her husband to arrive at 1130 for 1400 surgery on 11/28/21. To complete ERAS drink by 1100. She makes notes on her phone about these time changes and reads it back to Clinical research associate. ?

## 2021-11-28 ENCOUNTER — Inpatient Hospital Stay (HOSPITAL_COMMUNITY)
Admission: RE | Admit: 2021-11-28 | Discharge: 2021-11-30 | DRG: 620 | Disposition: A | Discharge status: Home / Self Care | Payer: 59 | Attending: Surgery | Admitting: Surgery

## 2021-11-28 ENCOUNTER — Encounter (HOSPITAL_COMMUNITY)
Admission: RE | Disposition: A | Discharge status: Home / Self Care | Payer: Self-pay | Source: Home / Self Care | Attending: Surgery

## 2021-11-28 ENCOUNTER — Inpatient Hospital Stay (HOSPITAL_COMMUNITY): Payer: 59 | Admitting: Anesthesiology

## 2021-11-28 ENCOUNTER — Inpatient Hospital Stay (HOSPITAL_COMMUNITY): Payer: 59 | Admitting: Physician Assistant

## 2021-11-28 ENCOUNTER — Other Ambulatory Visit: Payer: Self-pay

## 2021-11-28 ENCOUNTER — Encounter (HOSPITAL_COMMUNITY): Payer: Self-pay | Admitting: Surgery

## 2021-11-28 DIAGNOSIS — M19019 Primary osteoarthritis, unspecified shoulder: Secondary | ICD-10-CM | POA: Diagnosis present

## 2021-11-28 DIAGNOSIS — Z79899 Other long term (current) drug therapy: Secondary | ICD-10-CM

## 2021-11-28 DIAGNOSIS — Z6841 Body Mass Index (BMI) 40.0 and over, adult: Secondary | ICD-10-CM | POA: Diagnosis not present

## 2021-11-28 DIAGNOSIS — E875 Hyperkalemia: Secondary | ICD-10-CM | POA: Diagnosis present

## 2021-11-28 DIAGNOSIS — Z87891 Personal history of nicotine dependence: Secondary | ICD-10-CM | POA: Diagnosis not present

## 2021-11-28 DIAGNOSIS — Z8701 Personal history of pneumonia (recurrent): Secondary | ICD-10-CM | POA: Diagnosis not present

## 2021-11-28 DIAGNOSIS — Z981 Arthrodesis status: Secondary | ICD-10-CM

## 2021-11-28 DIAGNOSIS — I10 Essential (primary) hypertension: Secondary | ICD-10-CM

## 2021-11-28 DIAGNOSIS — Z8616 Personal history of COVID-19: Secondary | ICD-10-CM | POA: Diagnosis not present

## 2021-11-28 DIAGNOSIS — Z79890 Hormone replacement therapy (postmenopausal): Secondary | ICD-10-CM | POA: Diagnosis not present

## 2021-11-28 DIAGNOSIS — N179 Acute kidney failure, unspecified: Secondary | ICD-10-CM | POA: Diagnosis not present

## 2021-11-28 DIAGNOSIS — E039 Hypothyroidism, unspecified: Secondary | ICD-10-CM | POA: Diagnosis present

## 2021-11-28 DIAGNOSIS — Z86711 Personal history of pulmonary embolism: Secondary | ICD-10-CM

## 2021-11-28 DIAGNOSIS — Z87440 Personal history of urinary (tract) infections: Secondary | ICD-10-CM

## 2021-11-28 DIAGNOSIS — M169 Osteoarthritis of hip, unspecified: Secondary | ICD-10-CM | POA: Diagnosis present

## 2021-11-28 DIAGNOSIS — Z880 Allergy status to penicillin: Secondary | ICD-10-CM

## 2021-11-28 DIAGNOSIS — Z791 Long term (current) use of non-steroidal anti-inflammatories (NSAID): Secondary | ICD-10-CM | POA: Diagnosis not present

## 2021-11-28 DIAGNOSIS — M19171 Post-traumatic osteoarthritis, right ankle and foot: Secondary | ICD-10-CM | POA: Diagnosis present

## 2021-11-28 DIAGNOSIS — Z8249 Family history of ischemic heart disease and other diseases of the circulatory system: Secondary | ICD-10-CM

## 2021-11-28 HISTORY — PX: UPPER GI ENDOSCOPY: SHX6162

## 2021-11-28 HISTORY — PX: LAPAROSCOPIC GASTRIC SLEEVE RESECTION: SHX5895

## 2021-11-28 LAB — CBC WITH DIFFERENTIAL/PLATELET
Abs Immature Granulocytes: 0.03 10*3/uL (ref 0.00–0.07)
Basophils Absolute: 0 10*3/uL (ref 0.0–0.1)
Basophils Relative: 0 %
Eosinophils Absolute: 0 10*3/uL (ref 0.0–0.5)
Eosinophils Relative: 0 %
HCT: 46.8 % (ref 39.0–52.0)
Hemoglobin: 15.8 g/dL (ref 13.0–17.0)
Immature Granulocytes: 0 %
Lymphocytes Relative: 6 %
Lymphs Abs: 0.7 10*3/uL (ref 0.7–4.0)
MCH: 31.6 pg (ref 26.0–34.0)
MCHC: 33.8 g/dL (ref 30.0–36.0)
MCV: 93.6 fL (ref 80.0–100.0)
Monocytes Absolute: 0.1 10*3/uL (ref 0.1–1.0)
Monocytes Relative: 1 %
Neutro Abs: 10.7 10*3/uL — ABNORMAL HIGH (ref 1.7–7.7)
Neutrophils Relative %: 93 %
Platelets: 157 10*3/uL (ref 150–400)
RBC: 5 MIL/uL (ref 4.22–5.81)
RDW: 13.2 % (ref 11.5–15.5)
WBC: 11.7 10*3/uL — ABNORMAL HIGH (ref 4.0–10.5)
nRBC: 0 % (ref 0.0–0.2)

## 2021-11-28 LAB — ABO/RH: ABO/RH(D): A NEG

## 2021-11-28 LAB — TROPONIN I (HIGH SENSITIVITY): Troponin I (High Sensitivity): 4 ng/L (ref <18)

## 2021-11-28 SURGERY — GASTRECTOMY, SLEEVE, LAPAROSCOPIC
Anesthesia: General

## 2021-11-28 MED ORDER — FENTANYL CITRATE PF 50 MCG/ML IJ SOSY
PREFILLED_SYRINGE | INTRAMUSCULAR | Status: AC
  Filled 2021-11-28: qty 1

## 2021-11-28 MED ORDER — OXYCODONE HCL 5 MG PO TABS: 5.0000 mg | ORAL_TABLET | Freq: Once | ORAL | Status: AC | PRN

## 2021-11-28 MED ORDER — LEVOTHYROXINE SODIUM 100 MCG PO TABS
200.0000 ug | ORAL_TABLET | Freq: Every day | ORAL | Status: DC
  Administered 2021-11-29 – 2021-11-30 (×2): 200 ug via ORAL
  Filled 2021-11-28: qty 1
  Filled 2021-11-28 (×2): qty 2

## 2021-11-28 MED ORDER — OXYCODONE HCL 5 MG/5ML PO SOLN
5.0000 mg | Freq: Four times a day (QID) | ORAL | Status: DC | PRN
  Administered 2021-11-28 – 2021-11-30 (×2): 5 mg via ORAL
  Filled 2021-11-28 (×2): qty 5

## 2021-11-28 MED ORDER — BUPIVACAINE LIPOSOME 1.3 % IJ SUSP
INTRAMUSCULAR | Status: DC | PRN
  Administered 2021-11-28: 20 mL

## 2021-11-28 MED ORDER — BUPIVACAINE-EPINEPHRINE (PF) 0.25% -1:200000 IJ SOLN
INTRAMUSCULAR | Status: AC
  Filled 2021-11-28: qty 30

## 2021-11-28 MED ORDER — PROPOFOL 10 MG/ML IV BOLUS
INTRAVENOUS | Status: AC
  Filled 2021-11-28: qty 20

## 2021-11-28 MED ORDER — ENSURE MAX PROTEIN PO LIQD
2.0000 [oz_av] | ORAL | Status: DC
  Administered 2021-11-29 – 2021-11-30 (×8): 2 [oz_av] via ORAL
  Filled 2021-11-28 (×13): qty 330

## 2021-11-28 MED ORDER — APREPITANT 40 MG PO CAPS
40.0000 mg | ORAL_CAPSULE | ORAL | Status: AC
  Administered 2021-11-28: 40 mg via ORAL
  Filled 2021-11-28: qty 1

## 2021-11-28 MED ORDER — ACETAMINOPHEN 500 MG PO TABS: 1000.0000 mg | ORAL_TABLET | Freq: Once | ORAL | Status: DC

## 2021-11-28 MED ORDER — PANTOPRAZOLE SODIUM 40 MG IV SOLR
40.0000 mg | Freq: Every day | INTRAVENOUS | Status: DC
  Administered 2021-11-28 – 2021-11-29 (×2): 40 mg via INTRAVENOUS
  Filled 2021-11-28 (×2): qty 10

## 2021-11-28 MED ORDER — ALBUTEROL SULFATE (2.5 MG/3ML) 0.083% IN NEBU
INHALATION_SOLUTION | RESPIRATORY_TRACT | Status: AC
  Filled 2021-11-28: qty 3

## 2021-11-28 MED ORDER — DOCUSATE SODIUM 100 MG PO CAPS
100.0000 mg | ORAL_CAPSULE | Freq: Two times a day (BID) | ORAL | Status: DC
  Administered 2021-11-28 – 2021-11-30 (×4): 100 mg via ORAL
  Filled 2021-11-28 (×4): qty 1

## 2021-11-28 MED ORDER — LACTATED RINGERS IV SOLN: INTRAVENOUS | Status: DC

## 2021-11-28 MED ORDER — MAGNESIUM SULFATE 2 GM/50ML IV SOLN
2.0000 g | Freq: Once | INTRAVENOUS | Status: AC
  Administered 2021-11-28: 2 g via INTRAVENOUS
  Filled 2021-11-28: qty 50

## 2021-11-28 MED ORDER — CHLORHEXIDINE GLUCONATE 4 % EX LIQD: 60.0000 mL | Freq: Once | CUTANEOUS | Status: DC

## 2021-11-28 MED ORDER — ROCURONIUM BROMIDE 10 MG/ML (PF) SYRINGE
PREFILLED_SYRINGE | INTRAVENOUS | Status: AC
  Filled 2021-11-28: qty 10

## 2021-11-28 MED ORDER — TRAMADOL HCL 50 MG PO TABS: 50.0000 mg | ORAL_TABLET | Freq: Four times a day (QID) | ORAL | Status: DC | PRN

## 2021-11-28 MED ORDER — GABAPENTIN 300 MG PO CAPS
300.0000 mg | ORAL_CAPSULE | ORAL | Status: AC
  Administered 2021-11-28: 300 mg via ORAL
  Filled 2021-11-28: qty 1

## 2021-11-28 MED ORDER — MIDAZOLAM HCL 2 MG/2ML IJ SOLN
INTRAMUSCULAR | Status: AC
  Filled 2021-11-28: qty 2

## 2021-11-28 MED ORDER — ROCURONIUM BROMIDE 100 MG/10ML IV SOLN
INTRAVENOUS | Status: DC | PRN
  Administered 2021-11-28: 20 mg via INTRAVENOUS
  Administered 2021-11-28: 70 mg via INTRAVENOUS

## 2021-11-28 MED ORDER — ACETAMINOPHEN 500 MG PO TABS
1000.0000 mg | ORAL_TABLET | Freq: Three times a day (TID) | ORAL | Status: DC
  Administered 2021-11-29 – 2021-11-30 (×4): 1000 mg via ORAL
  Filled 2021-11-28 (×4): qty 2

## 2021-11-28 MED ORDER — CHLORHEXIDINE GLUCONATE 0.12 % MT SOLN
15.0000 mL | Freq: Once | OROMUCOSAL | Status: AC
  Administered 2021-11-28: 15 mL via OROMUCOSAL

## 2021-11-28 MED ORDER — METOCLOPRAMIDE HCL 5 MG/ML IJ SOLN
10.0000 mg | Freq: Four times a day (QID) | INTRAMUSCULAR | Status: DC
  Administered 2021-11-28 – 2021-11-30 (×7): 10 mg via INTRAVENOUS
  Filled 2021-11-28 (×7): qty 2

## 2021-11-28 MED ORDER — LACTATED RINGERS IR SOLN
Status: DC | PRN
  Administered 2021-11-28: 1000 mL

## 2021-11-28 MED ORDER — ORAL CARE MOUTH RINSE: 15.0000 mL | Freq: Once | OROMUCOSAL | Status: AC

## 2021-11-28 MED ORDER — ONDANSETRON HCL 4 MG/2ML IJ SOLN: 4.0000 mg | Freq: Once | INTRAMUSCULAR | Status: DC | PRN

## 2021-11-28 MED ORDER — KETAMINE HCL 10 MG/ML IJ SOLN
INTRAMUSCULAR | Status: AC
  Filled 2021-11-28: qty 1

## 2021-11-28 MED ORDER — DEXAMETHASONE SODIUM PHOSPHATE 10 MG/ML IJ SOLN
INTRAMUSCULAR | Status: DC | PRN
  Administered 2021-11-28: 6 mg via INTRAVENOUS

## 2021-11-28 MED ORDER — SCOPOLAMINE 1 MG/3DAYS TD PT72
1.0000 | MEDICATED_PATCH | TRANSDERMAL | Status: DC
  Administered 2021-11-28: 1.5 mg via TRANSDERMAL
  Filled 2021-11-28: qty 1

## 2021-11-28 MED ORDER — HYDROMORPHONE HCL 1 MG/ML IJ SOLN
0.5000 mg | INTRAMUSCULAR | Status: DC | PRN
  Filled 2021-11-28: qty 0.5

## 2021-11-28 MED ORDER — METHOCARBAMOL 500 MG IVPB - SIMPLE MED
500.0000 mg | Freq: Four times a day (QID) | INTRAVENOUS | Status: DC | PRN
  Filled 2021-11-28 (×3): qty 50

## 2021-11-28 MED ORDER — METOPROLOL TARTRATE 5 MG/5ML IV SOLN: 5.0000 mg | Freq: Four times a day (QID) | INTRAVENOUS | Status: DC | PRN

## 2021-11-28 MED ORDER — HYDRALAZINE HCL 20 MG/ML IJ SOLN: 10.0000 mg | INTRAMUSCULAR | Status: DC | PRN

## 2021-11-28 MED ORDER — EPHEDRINE SULFATE-NACL 50-0.9 MG/10ML-% IV SOSY
PREFILLED_SYRINGE | INTRAVENOUS | Status: DC | PRN
  Administered 2021-11-28: 5 mg via INTRAVENOUS

## 2021-11-28 MED ORDER — KETAMINE HCL 10 MG/ML IJ SOLN
INTRAMUSCULAR | Status: DC | PRN
  Administered 2021-11-28: 30 mg via INTRAVENOUS

## 2021-11-28 MED ORDER — 0.9 % SODIUM CHLORIDE (POUR BTL) OPTIME
TOPICAL | Status: DC | PRN
  Administered 2021-11-28: 1000 mL

## 2021-11-28 MED ORDER — ONDANSETRON HCL 4 MG/2ML IJ SOLN: 4.0000 mg | INTRAMUSCULAR | Status: DC | PRN

## 2021-11-28 MED ORDER — ACETAMINOPHEN 500 MG PO TABS
1000.0000 mg | ORAL_TABLET | ORAL | Status: AC
  Administered 2021-11-28: 1000 mg via ORAL
  Filled 2021-11-28: qty 2

## 2021-11-28 MED ORDER — OXYCODONE HCL 5 MG/5ML PO SOLN
5.0000 mg | Freq: Once | ORAL | Status: AC | PRN
  Administered 2021-11-28: 5 mg via ORAL

## 2021-11-28 MED ORDER — MIDAZOLAM HCL 5 MG/5ML IJ SOLN
INTRAMUSCULAR | Status: DC | PRN
  Administered 2021-11-28: 2 mg via INTRAVENOUS

## 2021-11-28 MED ORDER — FENTANYL CITRATE (PF) 100 MCG/2ML IJ SOLN
INTRAMUSCULAR | Status: AC
  Filled 2021-11-28: qty 2

## 2021-11-28 MED ORDER — SODIUM CHLORIDE 0.9 % IV SOLN: INTRAVENOUS | Status: DC

## 2021-11-28 MED ORDER — OXYCODONE HCL 5 MG/5ML PO SOLN
ORAL | Status: AC
Start: 2021-11-28 — End: 2021-11-29
  Filled 2021-11-28: qty 5

## 2021-11-28 MED ORDER — HEPARIN SODIUM (PORCINE) 5000 UNIT/ML IJ SOLN
5000.0000 [IU] | INTRAMUSCULAR | Status: AC
  Administered 2021-11-28: 5000 [IU] via SUBCUTANEOUS
  Filled 2021-11-28: qty 1

## 2021-11-28 MED ORDER — AMLODIPINE BESYLATE 5 MG PO TABS: 5.0000 mg | ORAL_TABLET | Freq: Every day | ORAL | Status: DC

## 2021-11-28 MED ORDER — BUPIVACAINE LIPOSOME 1.3 % IJ SUSP
INTRAMUSCULAR | Status: AC
  Filled 2021-11-28: qty 20

## 2021-11-28 MED ORDER — FENTANYL CITRATE PF 50 MCG/ML IJ SOSY
25.0000 ug | PREFILLED_SYRINGE | INTRAMUSCULAR | Status: DC | PRN
  Administered 2021-11-28 (×2): 25 ug via INTRAVENOUS

## 2021-11-28 MED ORDER — BUPIVACAINE LIPOSOME 1.3 % IJ SUSP: 20.0000 mL | Freq: Once | INTRAMUSCULAR | Status: DC

## 2021-11-28 MED ORDER — ALBUTEROL SULFATE (2.5 MG/3ML) 0.083% IN NEBU: 2.5000 mg | INHALATION_SOLUTION | Freq: Two times a day (BID) | RESPIRATORY_TRACT | Status: DC | PRN

## 2021-11-28 MED ORDER — GABAPENTIN 100 MG PO CAPS
200.0000 mg | ORAL_CAPSULE | Freq: Two times a day (BID) | ORAL | Status: DC
  Administered 2021-11-28 – 2021-11-30 (×4): 200 mg via ORAL
  Filled 2021-11-28 (×4): qty 2

## 2021-11-28 MED ORDER — AMISULPRIDE (ANTIEMETIC) 5 MG/2ML IV SOLN: 10.0000 mg | Freq: Once | INTRAVENOUS | Status: DC | PRN

## 2021-11-28 MED ORDER — BUPIVACAINE-EPINEPHRINE (PF) 0.25% -1:200000 IJ SOLN
INTRAMUSCULAR | Status: DC | PRN
  Administered 2021-11-28: 30 mL

## 2021-11-28 MED ORDER — STERILE WATER FOR IRRIGATION IR SOLN
Status: DC | PRN
  Administered 2021-11-28: 2000 mL

## 2021-11-28 MED ORDER — EPHEDRINE 5 MG/ML INJ
INTRAVENOUS | Status: AC
  Filled 2021-11-28: qty 5

## 2021-11-28 MED ORDER — PROPOFOL 10 MG/ML IV BOLUS
INTRAVENOUS | Status: DC | PRN
  Administered 2021-11-28: 200 mg via INTRAVENOUS

## 2021-11-28 MED ORDER — DEXMEDETOMIDINE (PRECEDEX) IN NS 20 MCG/5ML (4 MCG/ML) IV SYRINGE
PREFILLED_SYRINGE | INTRAVENOUS | Status: DC | PRN
  Administered 2021-11-28: 8 ug via INTRAVENOUS
  Administered 2021-11-28: 12 ug via INTRAVENOUS

## 2021-11-28 MED ORDER — SUGAMMADEX SODIUM 200 MG/2ML IV SOLN
INTRAVENOUS | Status: DC | PRN
  Administered 2021-11-28: 400 mg via INTRAVENOUS

## 2021-11-28 MED ORDER — SODIUM CHLORIDE 0.9 % IV SOLN
2.0000 g | INTRAVENOUS | Status: AC
  Administered 2021-11-28: 2 g via INTRAVENOUS
  Filled 2021-11-28: qty 2

## 2021-11-28 MED ORDER — FENTANYL CITRATE (PF) 100 MCG/2ML IJ SOLN
INTRAMUSCULAR | Status: DC | PRN
  Administered 2021-11-28 (×2): 50 ug via INTRAVENOUS

## 2021-11-28 MED ORDER — ENOXAPARIN SODIUM 30 MG/0.3ML IJ SOSY
30.0000 mg | PREFILLED_SYRINGE | Freq: Two times a day (BID) | INTRAMUSCULAR | Status: DC
  Administered 2021-11-28 – 2021-11-29 (×3): 30 mg via SUBCUTANEOUS
  Filled 2021-11-28 (×3): qty 0.3

## 2021-11-28 MED ORDER — LIDOCAINE HCL (CARDIAC) PF 100 MG/5ML IV SOSY
PREFILLED_SYRINGE | INTRAVENOUS | Status: DC | PRN
  Administered 2021-11-28: 100 mg via INTRAVENOUS

## 2021-11-28 MED ORDER — ACETAMINOPHEN 160 MG/5ML PO SOLN
1000.0000 mg | Freq: Three times a day (TID) | ORAL | Status: DC
  Administered 2021-11-28: 1000 mg via ORAL
  Filled 2021-11-28 (×2): qty 40.6

## 2021-11-28 MED ORDER — ONDANSETRON HCL 4 MG/2ML IJ SOLN
INTRAMUSCULAR | Status: DC | PRN
  Administered 2021-11-28: 4 mg via INTRAVENOUS

## 2021-11-28 SURGICAL SUPPLY — 76 items
APL PRP STRL LF DISP 70% ISPRP (MISCELLANEOUS) ×2
APPLIER CLIP ROT 10 11.4 M/L (STAPLE)
APPLIER CLIP ROT 13.4 12 LRG (CLIP)
APR CLP LRG 13.4X12 ROT 20 MLT (CLIP)
APR CLP MED LRG 11.4X10 (STAPLE)
BAG COUNTER SPONGE SURGICOUNT (BAG) IMPLANT
BAG LAPAROSCOPIC 12 15 PORT 16 (BASKET) IMPLANT
BAG RETRIEVAL 12/15 (BASKET) ×2
BAG SPNG CNTER NS LX DISP (BAG)
BENZOIN TINCTURE PRP APPL 2/3 (GAUZE/BANDAGES/DRESSINGS) ×2 IMPLANT
BLADE SURG SZ11 CARB STEEL (BLADE) ×2 IMPLANT
BNDG ADH 1X3 SHEER STRL LF (GAUZE/BANDAGES/DRESSINGS) ×12 IMPLANT
CABLE HIGH FREQUENCY MONO STRZ (ELECTRODE) IMPLANT
CHLORAPREP W/TINT 26 (MISCELLANEOUS) ×4 IMPLANT
CLIP APPLIE ROT 10 11.4 M/L (STAPLE) IMPLANT
CLIP APPLIE ROT 13.4 12 LRG (CLIP) IMPLANT
COVER SURGICAL LIGHT HANDLE (MISCELLANEOUS) ×2 IMPLANT
DEVICE SUT QUICK LOAD TK 5 (STAPLE) IMPLANT
DEVICE SUT TI-KNOT TK 5X26 (MISCELLANEOUS) IMPLANT
DRAPE UTILITY XL STRL (DRAPES) ×4 IMPLANT
ELECT REM PT RETURN 15FT ADLT (MISCELLANEOUS) ×2 IMPLANT
GAUZE SPONGE 4X4 12PLY STRL (GAUZE/BANDAGES/DRESSINGS) IMPLANT
GLOVE BIO SURGEON STRL SZ 6 (GLOVE) ×2 IMPLANT
GLOVE INDICATOR 6.5 STRL GRN (GLOVE) ×2 IMPLANT
GLOVE SS BIOGEL STRL SZ 6 (GLOVE) ×1 IMPLANT
GLOVE SUPERSENSE BIOGEL SZ 6 (GLOVE) ×1
GOWN STRL REUS W/ TWL LRG LVL3 (GOWN DISPOSABLE) ×1 IMPLANT
GOWN STRL REUS W/ TWL XL LVL3 (GOWN DISPOSABLE) IMPLANT
GOWN STRL REUS W/TWL LRG LVL3 (GOWN DISPOSABLE) ×2
GOWN STRL REUS W/TWL XL LVL3 (GOWN DISPOSABLE)
GRASPER SUT TROCAR 14GX15 (MISCELLANEOUS) ×2 IMPLANT
IRRIG SUCT STRYKERFLOW 2 WTIP (MISCELLANEOUS) ×2
IRRIGATION SUCT STRKRFLW 2 WTP (MISCELLANEOUS) ×1 IMPLANT
KIT BASIN OR (CUSTOM PROCEDURE TRAY) ×2 IMPLANT
KIT TURNOVER KIT A (KITS) IMPLANT
MARKER SKIN DUAL TIP RULER LAB (MISCELLANEOUS) ×2 IMPLANT
MAT PREVALON FULL STRYKER (MISCELLANEOUS) ×2 IMPLANT
NDL SPNL 22GX3.5 QUINCKE BK (NEEDLE) ×1 IMPLANT
NEEDLE SPNL 22GX3.5 QUINCKE BK (NEEDLE) ×2 IMPLANT
PACK UNIVERSAL I (CUSTOM PROCEDURE TRAY) ×2 IMPLANT
RELOAD ENDO STITCH (ENDOMECHANICALS) IMPLANT
RELOAD STAPLE 60 3.6 BLU REG (STAPLE) ×1 IMPLANT
RELOAD STAPLE 60 3.8 GOLD REG (STAPLE) ×1 IMPLANT
RELOAD STAPLE 60 4.1 GRN THCK (STAPLE) IMPLANT
RELOAD STAPLER BLUE 60MM (STAPLE) ×5 IMPLANT
RELOAD STAPLER GOLD 60MM (STAPLE) ×2 IMPLANT
RELOAD STAPLER GREEN 60MM (STAPLE) ×1 IMPLANT
RELOAD SUT TRIPLE-STITCH 2-0 (ENDOMECHANICALS) IMPLANT
SCISSORS LAP 5X45 EPIX DISP (ENDOMECHANICALS) ×2 IMPLANT
SET TUBE SMOKE EVAC HIGH FLOW (TUBING) ×2 IMPLANT
SHEARS HARMONIC ACE PLUS 45CM (MISCELLANEOUS) ×2 IMPLANT
SLEEVE ADV FIXATION 5X100MM (TROCAR) ×4 IMPLANT
SLEEVE GASTRECTOMY 40FR VISIGI (MISCELLANEOUS) ×2 IMPLANT
SOL ANTI FOG 6CC (MISCELLANEOUS) ×1 IMPLANT
SOLUTION ANTI FOG 6CC (MISCELLANEOUS) ×1
SPIKE FLUID TRANSFER (MISCELLANEOUS) ×2 IMPLANT
SPONGE T-LAP 18X18 ~~LOC~~+RFID (SPONGE) ×2 IMPLANT
STAPLE LINE REINFORCEMENT LAP (STAPLE) ×5 IMPLANT
STAPLER ECHELON LONG 60 440 (INSTRUMENTS) ×2 IMPLANT
STAPLER RELOAD BLUE 60MM (STAPLE) ×10
STAPLER RELOAD GOLD 60MM (STAPLE) ×4
STAPLER RELOAD GREEN 60MM (STAPLE) ×2
STRIP CLOSURE SKIN 1/2X4 (GAUZE/BANDAGES/DRESSINGS) ×2 IMPLANT
SUT MNCRL AB 4-0 PS2 18 (SUTURE) ×2 IMPLANT
SUT SURGIDAC NAB ES-9 0 48 120 (SUTURE) IMPLANT
SUT VICRYL 0 TIES 12 18 (SUTURE) ×2 IMPLANT
SYR 10ML ECCENTRIC (SYRINGE) ×2 IMPLANT
SYR 20ML LL LF (SYRINGE) ×2 IMPLANT
SYR 50ML LL SCALE MARK (SYRINGE) ×2 IMPLANT
TOWEL OR 17X26 10 PK STRL BLUE (TOWEL DISPOSABLE) ×2 IMPLANT
TOWEL OR NON WOVEN STRL DISP B (DISPOSABLE) ×2 IMPLANT
TROCAR ADV FIXATION 5X100MM (TROCAR) ×2 IMPLANT
TROCAR BLADELESS 15MM (ENDOMECHANICALS) ×2 IMPLANT
TROCAR BLADELESS OPT 5 100 (ENDOMECHANICALS) ×2 IMPLANT
TUBING CONNECTING 10 (TUBING) ×2 IMPLANT
TUBING ENDO SMARTCAP (MISCELLANEOUS) ×2 IMPLANT

## 2021-11-28 NOTE — Progress Notes (Signed)
During transfer from stretcher to bed, pt walked to bed, sat down, and then fell back across the bed with labored breathing, stating he was having trouble breathing. At this time he was unable to answer questions and was staring off, not focusing on anything. Vitals taken, see flowsheets, rapid called. Pt came out of it and was back to A&Ox4 but did not remember the episode. On call Marlou Starks notified via phone call, see new orders.  EKG done, see flowsheets. Aprox 20 mins later, pt began having trouble breathing again, rapid called, pain meds given. Pt to be transferred to tele when bed available. Pt currently A&Ox4, non-labored regular breathing.  ?

## 2021-11-28 NOTE — Progress Notes (Signed)
PHARMACY CONSULT FOR:  Risk Assessment for Post-Discharge VTE Following Bariatric Surgery ? ?Post-Discharge VTE Risk Assessment: ?This patient's probability of 30-day post-discharge VTE is increased due to the factors marked: ?X Sleeve gastrectomy  ? Liver disorder (transplant, cirrhosis, or nonalcoholic steatohepatitis)  ? Hx of VTE  ? Hemorrhage requiring transfusion  ? GI perforation, leak, or obstruction  ? ====================================================  ?X  Male  ?X  Age >/=60 years  ?  BMI >/=50 kg/m2  ?  CHF  ?  Dyspnea at Rest  ?  Paraplegia  ?X  Non-gastric-band surgery  ?  Operation Time >/=3 hr  ?  Return to OR   ?  Length of Stay >/= 3 d  ? Hypercoagulable condition  ? Significant venous stasis  ? ? ?Predicted probability of 30-day post-discharge VTE: 0.59% (MODERATE RISK) ? ?Other patient-specific factors to consider: n/a ? ? ?Recommendation for Discharge: ?Enoxaparin 40 mg [place] q12h x 2 weeks post-discharge ?Case manager consulted to review insurance coverage and provide estimated patient cost ?Follow daily and recalculate estimated 30d VTE risk if returns to OR or LOS reaches 3 days. ? ? ?Brett Gibbs is a 66 y.o. 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  ?Case start: 1448 ?Case end: 1537 ? ? ?Allergies  ?Allergen Reactions  ? Penicillins Hives and Itching  ? ? ?Patient Measurements: ?Weight: (!) 139.7 kg (308 lb) ?Body mass index is 44.19 kg/m?. ? ?Recent Labs  ?  11/28/21 ?1908  ?WBC 11.7*  ?HGB 15.8  ?HCT 46.8  ?PLT 157  ? ?Estimated Creatinine Clearance: 120.8 mL/min (by C-G formula based on SCr of 0.86 mg/dL). ? ?Past Medical History:  ?Diagnosis Date  ? A-fib (HCC)   ? Arthritis   ? Deep vein blood clot of right lower extremity (HCC)   ? Dysrhythmia   ? Hypertension   ? Hypothyroidism   ? Pneumonia   ? Pulmonary embolism (HCC)   ? Thyroid disease   ? ? ?Medications Prior to Admission  ?Medication Sig Dispense Refill Last Dose  ? amLODipine-valsartan (EXFORGE) 5-320  MG tablet Take 1 tablet by mouth every morning.   Past Week  ? aspirin EC 81 MG tablet Take 81 mg by mouth every morning. Swallow whole.   Past Week  ? Glucosamine HCl 1500 MG TABS Take 1,500 mg by mouth every morning.   Past Week  ? levothyroxine (SYNTHROID) 200 MCG tablet Take 200 mcg by mouth every morning.   11/28/2021  ? meloxicam (MOBIC) 7.5 MG tablet Take 7.5 mg by mouth every morning.   Past Week  ? Multiple Vitamin (MULTIVITAMIN WITH MINERALS) TABS tablet Take 1 tablet by mouth every morning.   Past Week  ? albuterol (VENTOLIN HFA) 108 (90 Base) MCG/ACT inhaler Inhale 2 puffs into the lungs 2 (two) times daily as needed for wheezing or shortness of breath.   More than a month  ? ? ? ?Bernadene Person, PharmD, BCPS ?[phone] ?11/28/2021, 8:10 PM ? ? ?

## 2021-11-28 NOTE — Anesthesia Preprocedure Evaluation (Signed)
Anesthesia Evaluation  ?Patient identified by MRN, date of birth, ID band ?Patient awake ? ? ? ?Reviewed: ?Allergy & Precautions, NPO status , Patient's Chart, lab work & pertinent test results ? ?History of Anesthesia Complications ?Negative for: history of anesthetic complications ? ?Airway ?Mallampati: II ? ?TM Distance: >3 FB ?Neck ROM: Full ? ? ? Dental ? ?(+) Dental Advisory Given, Teeth Intact ?  ?Pulmonary ?former smoker, PE ?  ?Pulmonary exam normal ? ? ? ? ? ? ? Cardiovascular ?hypertension, Pt. on medications ?+ DVT  ?Normal cardiovascular exam+ dysrhythmias Atrial Fibrillation  ? ? ?  ?Neuro/Psych ?negative neurological ROS ?   ? GI/Hepatic ?negative GI ROS, Neg liver ROS,   ?Endo/Other  ?Hypothyroidism Morbid obesity ? Renal/GU ?negative Renal ROS  ?negative genitourinary ?  ?Musculoskeletal ? ?(+) Arthritis ,  ? Abdominal ?  ?Peds ? Hematology ?negative hematology ROS ?(+)   ?Anesthesia Other Findings ? ? Reproductive/Obstetrics ? ?  ? ? ? ? ? ? ? ? ? ? ? ? ? ?  ?  ? ? ? ? ? ? ? ?Anesthesia Physical ?Anesthesia Plan ? ?ASA: 3 ? ?Anesthesia Plan: General  ? ?Post-op Pain Management: Tylenol PO (pre-op)* and Gabapentin PO (pre-op)*  ? ?Induction: Intravenous ? ?PONV Risk Score and Plan: 3 and Ondansetron, Dexamethasone, Treatment may vary due to age or medical condition and Midazolam ? ?Airway Management Planned: Oral ETT ? ?Additional Equipment: None ? ?Intra-op Plan:  ? ?Post-operative Plan: Extubation in OR ? ?Informed Consent: I have reviewed the patients History and Physical, chart, labs and discussed the procedure including the risks, benefits and alternatives for the proposed anesthesia with the patient or authorized representative who has indicated his/her understanding and acceptance.  ? ? ? ?Dental advisory given ? ?Plan Discussed with:  ? ?Anesthesia Plan Comments:   ? ? ? ? ? ? ?Anesthesia Quick Evaluation ? ?

## 2021-11-28 NOTE — Anesthesia Procedure Notes (Signed)
Procedure Name: Intubation ?Date/Time: 11/28/2021 2:32 PM ?Performed by: [place] Severin Bou C, CRNA ?Pre-anesthesia Checklist: Patient identified, Emergency Drugs available, Suction available and Patient being monitored ?Patient Re-evaluated:Patient Re-evaluated prior to induction ?Oxygen Delivery Method: Circle system utilized ?Preoxygenation: Pre-oxygenation with 100% oxygen ?Induction Type: IV induction ?Ventilation: Mask ventilation without difficulty ?Laryngoscope Size: Mac and 4 ?Grade View: Grade I ?Tube type: Oral ?Tube size: 7.5 mm ?Number of attempts: 1 ?Airway Equipment and Method: Stylet and Oral airway ?Placement Confirmation: ETT inserted through vocal cords under direct vision, positive ETCO2 and breath sounds checked- equal and bilateral ?Secured at: 21 cm ?Tube secured with: Tape ?Dental Injury: Teeth and Oropharynx as per pre-operative assessment  ? ? ? ? ?

## 2021-11-28 NOTE — Op Note (Signed)
Preoperative diagnosis: laparoscopic sleeve gastrectomy  Postoperative diagnosis: Same   Procedure: Upper endoscopy   Surgeon: La Shehan, M.D.  Anesthesia: Gen.   Indications for procedure: This patient was undergoing a laparoscopic sleeve gastrectomy.   Description of procedure: The endoscopy was placed in the mouth and into the oropharynx and under endoscopic vision it was advanced to the esophagogastric junction.  The stomach was insufflated and no bleeding or bubbles were seen.  The GEJ was identified at 38 cm from the teeth. No bleeding or leaks were detected. The scope was withdrawn without difficulty.    Brett Gibbs, M.D. General, Bariatric, & Minimally Invasive Surgery [facility]   

## 2021-11-28 NOTE — Transfer of Care (Signed)
Immediate Anesthesia Transfer of Care Note ? ?Patient: Brett Gibbs ? ?Procedure(s) Performed: LAPAROSCOPIC SLEEVE GASTRECTOMY ?UPPER GI ENDOSCOPY ? ?Patient Location: PACU ? ?Anesthesia Type:General ? ?Level of Consciousness: awake ? ?Airway & Oxygen Therapy: Patient Spontanous Breathing and Patient connected to face mask oxygen ? ?Post-op Assessment: Report given to RN and Post -op Vital signs reviewed and stable ? ?Post vital signs: Reviewed and stable ? ?Last Vitals:  ?Vitals Value Taken Time  ?BP 121/93 11/28/21 1600  ?Temp 36.5 ?C 11/28/21 1600  ?Pulse 59 11/28/21 1603  ?Resp 14 11/28/21 1603  ?SpO2 90 % 11/28/21 1603  ?Vitals shown include unvalidated device data. ? ?Last Pain:  ?Vitals:  ? 11/28/21 1600  ?TempSrc:   ?PainSc: 0-No pain  ?   ? ?  ? ?Complications: No notable events documented. ?

## 2021-11-28 NOTE — Op Note (Signed)
Operative Note ? ?Brett Gibbs  ?[id number]  ?[id number]  ?11/28/2021 ? ? ?Surgeon: Phylliss Blakes MD FACS ?  ?Assistant: Feliciana Rossetti MD FACS ?  ?Procedure performed: laparoscopic sleeve gastrectomy, upper endoscopy ?  ?Preop diagnosis: Morbid obesity Body mass index is 44.19 kg/m?Marland Kitchen ?Post-op diagnosis/intraop findings: same ?  ?Specimens: fundus ?Retained items: none  ?EBL: minimal  ?Complications: none ?  ?Description of procedure: After obtaining informed consent and administration of chemical DVT prophylaxis in holding, the patient was taken to the operating room and placed supine on operating room table where general endotracheal anesthesia was initiated, preoperative antibiotics were administered, SCDs applied, and a formal timeout was performed. The abdomen was prepped and draped in usual sterile fashion. Peritoneal access was gained using a Visiport technique in the left upper quadrant and insufflation to 15 mmHg ensued without issue. Gross inspection revealed no evidence of injury.  Under direct visualization three more 5 mm trochars were placed in the right and left hemiabdomen and the 42mm trocar in the right paramedian upper abdomen. Bilateral laparoscopic assisted TAPS blocks were performed with Exparel diluted with 0.25 percent Marcaine with epinephrine. The patient was placed in steep Trendelenburg and the liver retractor was introduced through an incision in the upper midline and secured to the post externally to maintain the left lobe retracted anteriorly.  ?There is no hiatal hernia. Using the Harmonic scalpel, the greater curvature of the stomach was dissected away from the greater omentum and short gastric vessels were divided. This began 6 cm from the pylorus, and dissection proceeded until the left crus was clearly exposed. There were some filmy adhesions of the posterior stomach to the retroperitoneum which were divided with the Harmonic. Esophageal fat pad was mobilized off the anterior  stomach slightly. The 72 [place] VisiGi was then introduced and directed down towards the pylorus. This was placed to suction against the lesser curve. Serial fires of the linear cutting stapler with seamguards were then employed to create our sleeve. The first fire used a green load and ensured adequate room at the angularis incisura. One gold load and then several blue loads were then employed to create a narrow tubular stomach up to the angle of His. The excised stomach was then removed through our 15 mm trocar site within an Endo Catch bag.  ?The visigi was taken off of suction and a few puffs of air were introduced, inflating the sleeve. No bubbles were observed in the irrigation fluid around the stomach and the shape was noted to be evenly tubular without any narrowing at the angularis. The visigi was then removed. Upper endoscopy was performed by the assistant surgeon and the sleeve was noted to be airtight, the staple line was hemostatic. Please see his separate note. The endoscope was removed. The 15 mm trocar site fascia in the right upper abdomen was closed with a 0 Vicryl using the laparoscopic suture passer under direct visualization. The liver retractor was removed under direct visualization. The abdomen was then desufflated and all remaining trochars removed. The skin incisions were closed with subcuticular Monocryl; benzoin, Steri-Strips and Band-Aids were applied The patient was then awakened, extubated and taken to PACU in stable condition.   ?  ?All counts were correct at the completion of the case.  ?  ? ?

## 2021-11-28 NOTE — Interval H&P Note (Signed)
History and Physical Interval Note: ? ?11/28/2021 ?12:03 PM ? ?Brett Gibbs  has presented today for surgery, with the diagnosis of MORBID OBESITY.  The various methods of treatment have been discussed with the patient and family. After consideration of risks, benefits and other options for treatment, the patient has consented to  Procedure(s): ?LAPAROSCOPIC SLEEVE GASTRECTOMY (N/A) ?UPPER GI ENDOSCOPY (N/A) as a surgical intervention.  The patient's history has been reviewed, patient examined, no change in status, stable for surgery.  I have reviewed the patient's chart and labs.  Questions were answered to the patient's satisfaction.   ? ? ?[name] ? ? ?

## 2021-11-28 NOTE — Significant Event (Signed)
Rapid Response Event Note  ? ?Reason for Call :  ?Notified by [facility] staff at approximately 1810 that patient needing Rapid Response to beside. Upon arrival, patient hyperventilating and have difficulty breathing. PACU RN present at beside stating that patient just walked from stretcher to inpatient bed and started to have severe surgical site pain. Patient did receive a laparoscopic sleeve gastrectomy today. After patient began to relax, patient was able to follow commands and was alert/oriented x4. Patient was having some bradycardia per pulse oximetry monitor but was asymptomatic see vital signs. Per PACU RN, bradycardia was also noted during actual surgical procedure.  ?Notified again for uncontrolled pain with hyperventilation around 1900, after pain controlled patient was able to follow commands with ease/alert and oriented x4. George Ina Rapid RN for night shift assisted in second call.  ? ?Initial Focused Assessment:  ?Neuro: Alert and oriented x4, able to move extremities, pupils equal round and reactive to light, 2+, bilaterally ?Pain: Surgical site pain-left upper quadrant, 8/10 per patient  ?Cardiac: Bradycardia, EKG eventually obtained showing sinus bradycardia, BP 134/96, no adventitious heart sounds heard upon auscultation, S1 and S2 heard upon auscultation ?Pulmonary: Nasal Canula 2 Liters, O2 sats 98-100%, RR 17-20, breath sounds clear and diminished in all anterior lung fields bilaterally  ?Abdomen: Laparoscopic sites present, dry and intact, no redness, swelling, or bruising around sites ?Abdomen soft but tender especially in left upper quadrant  ?  ?Interventions:  ?Notify MD Carolynne Edouard in regards to needing telemetry order because bradycardia ? ? ?Plan of Care:  ?Transfer to telemetry once bed is available, continue to manage pain with PRNs to control pain. Notify Rapid Response if patient begins to hyperventilate and having uncontrolled pain or significant change in mental status/hemodynamic  status.   ? ? ?Event Summary:  ? ?MD Notified: MD Carolynne Edouard notified by bedside RN  ?Call Time: Rapid Called at 1810 and 1900  ?Arrival Time: 44 and 1910  ?End Time: 1930 ? ?Henrene Hawking, RN ?

## 2021-11-29 ENCOUNTER — Encounter (HOSPITAL_COMMUNITY): Payer: Self-pay | Admitting: Surgery

## 2021-11-29 LAB — BASIC METABOLIC PANEL
Anion gap: 5 (ref 5–15)
BUN: 27 mg/dL — ABNORMAL HIGH (ref 8–23)
CO2: 24 mmol/L (ref 22–32)
Calcium: 9.3 mg/dL (ref 8.9–10.3)
Chloride: 110 mmol/L (ref 98–111)
Creatinine, Ser: 0.96 mg/dL (ref 0.61–1.24)
GFR, Estimated: >60 mL/min (ref >60)
Glucose, Bld: 122 mg/dL — ABNORMAL HIGH (ref 70–99)
Potassium: 5.7 mmol/L — ABNORMAL HIGH (ref 3.5–5.1)
Sodium: 139 mmol/L (ref 135–145)

## 2021-11-29 LAB — COMPREHENSIVE METABOLIC PANEL
ALT: 41 U/L (ref 0–44)
AST: 31 U/L (ref 15–41)
Albumin: 3.9 g/dL (ref 3.5–5.0)
Alkaline Phosphatase: 46 U/L (ref 38–126)
Anion gap: 12 (ref 5–15)
BUN: 31 mg/dL — ABNORMAL HIGH (ref 8–23)
CO2: 18 mmol/L — ABNORMAL LOW (ref 22–32)
Calcium: 9.6 mg/dL (ref 8.9–10.3)
Chloride: 108 mmol/L (ref 98–111)
Creatinine, Ser: 1.26 mg/dL — ABNORMAL HIGH (ref 0.61–1.24)
GFR, Estimated: >60 mL/min (ref >60)
Glucose, Bld: 145 mg/dL — ABNORMAL HIGH (ref 70–99)
Potassium: 5.5 mmol/L — ABNORMAL HIGH (ref 3.5–5.1)
Sodium: 138 mmol/L (ref 135–145)
Total Bilirubin: 0.9 mg/dL (ref 0.3–1.2)
Total Protein: 6.7 g/dL (ref 6.5–8.1)

## 2021-11-29 LAB — CBC WITH DIFFERENTIAL/PLATELET
Abs Immature Granulocytes: 0.03 10*3/uL (ref 0.00–0.07)
Basophils Absolute: 0 10*3/uL (ref 0.0–0.1)
Basophils Relative: 0 %
Eosinophils Absolute: 0 10*3/uL (ref 0.0–0.5)
Eosinophils Relative: 0 %
HCT: 47 % (ref 39.0–52.0)
Hemoglobin: 15.2 g/dL (ref 13.0–17.0)
Immature Granulocytes: 0 %
Lymphocytes Relative: 5 %
Lymphs Abs: 0.5 10*3/uL — ABNORMAL LOW (ref 0.7–4.0)
MCH: 31.9 pg (ref 26.0–34.0)
MCHC: 32.3 g/dL (ref 30.0–36.0)
MCV: 98.7 fL (ref 80.0–100.0)
Monocytes Absolute: 0.1 10*3/uL (ref 0.1–1.0)
Monocytes Relative: 1 %
Neutro Abs: 8.4 10*3/uL — ABNORMAL HIGH (ref 1.7–7.7)
Neutrophils Relative %: 94 %
Platelets: 141 10*3/uL — ABNORMAL LOW (ref 150–400)
RBC: 4.76 MIL/uL (ref 4.22–5.81)
RDW: 13.2 % (ref 11.5–15.5)
WBC: 9 10*3/uL (ref 4.0–10.5)
nRBC: 0 % (ref 0.0–0.2)

## 2021-11-29 LAB — GLUCOSE, CAPILLARY: Glucose-Capillary: 115 mg/dL — ABNORMAL HIGH (ref 70–99)

## 2021-11-29 MED ORDER — PROPOFOL 500 MG/50ML IV EMUL
INTRAVENOUS | Status: AC
Start: 2021-11-29 — End: ?
  Filled 2021-11-29: qty 100

## 2021-11-29 MED ORDER — PROPOFOL 1000 MG/100ML IV EMUL
INTRAVENOUS | Status: AC
  Filled 2021-11-29: qty 100

## 2021-11-29 MED ORDER — ONDANSETRON 4 MG PO TBDP: 4.0000 mg | ORAL_TABLET | Freq: Four times a day (QID) | ORAL | 0 refills | Status: AC | PRN

## 2021-11-29 MED ORDER — AMLODIPINE BESYLATE 5 MG PO TABS: 5.0000 mg | ORAL_TABLET | Freq: Every day | ORAL | 0 refills | Status: AC

## 2021-11-29 MED ORDER — DEXTROSE 50 % IV SOLN
1.0000 | Freq: Once | INTRAVENOUS | Status: AC
  Administered 2021-11-29: 50 mL via INTRAVENOUS
  Filled 2021-11-29: qty 50

## 2021-11-29 MED ORDER — GABAPENTIN 100 MG PO CAPS: 200.0000 mg | ORAL_CAPSULE | Freq: Two times a day (BID) | ORAL | 0 refills | Status: AC

## 2021-11-29 MED ORDER — ENOXAPARIN SODIUM 40 MG/0.4ML IJ SOSY: 40.0000 mg | PREFILLED_SYRINGE | Freq: Two times a day (BID) | INTRAMUSCULAR | 0 refills | Status: AC

## 2021-11-29 MED ORDER — TRAMADOL HCL 50 MG PO TABS: 50.0000 mg | ORAL_TABLET | Freq: Four times a day (QID) | ORAL | 0 refills | Status: AC | PRN

## 2021-11-29 MED ORDER — INSULIN ASPART 100 UNIT/ML IV SOLN
10.0000 [IU] | Freq: Once | INTRAVENOUS | Status: AC
  Administered 2021-11-29: 10 [IU] via INTRAVENOUS

## 2021-11-29 MED ORDER — PANTOPRAZOLE SODIUM 40 MG PO TBEC: 40.0000 mg | DELAYED_RELEASE_TABLET | Freq: Every day | ORAL | 0 refills | Status: AC

## 2021-11-29 MED ORDER — ENOXAPARIN (LOVENOX) PATIENT EDUCATION KIT
PACK | Freq: Once | Status: AC
  Filled 2021-11-29: qty 1

## 2021-11-29 MED ORDER — ACETAMINOPHEN 500 MG PO TABS: 1000.0000 mg | ORAL_TABLET | Freq: Three times a day (TID) | ORAL | 0 refills | Status: AC

## 2021-11-29 NOTE — Progress Notes (Signed)
Patient alert and oriented and sitting in recliner this morning on Post op day 1.  Pt states that he "feels much better today than yesterday". Provided support and encouragement.  Encouraged pulmonary toilet, ambulation and small sips of liquids.  All questions answered.  Will continue to monitor.  ?

## 2021-11-29 NOTE — Progress Notes (Signed)
S: Event of yesterday evening noted.  Patient reports that when he got up to move from the stretcher to the bed on arriving to the floor from PACU he had a fair amount of pain and then he sat down on the bed and he does not remember what happened after that.  Nursing documents 2 episodes of shortness of breath within a short interval.  EKG and troponin negative.  His vital signs remain normal throughout this.  The patient reports the rest of the evening was uneventful.  He denies any recurrent episodes, reports that he has some epigastric discomfort as well as pain in the left upper quadrant, which is mild.  Denies any shortness of breath or chest pain.  He is tolerating water without any issues. ? ?O: ?Vitals, labs, intake/output, and orders reviewed at this time.  Afebrile, heart rate for the most part in the 50s, 70 this morning.  Normotensive, saturating 9200% on room air, respirations 16.  P.o. intake and urine output are not recorded, but patient reports he has finished 1 full cup of water.  CBC unremarkable, white count 9 (5.7 preop), hemoglobin 15.2 (15.1 preop), platelets 141 (182).  CMP notable for mild increase in creatinine to 1.26 from baseline preop of 0.86, BUN is elevated as well to 31 from 18 preop, potassium is 5.5 (4.6 preop), bicarb is 18 (29).  ? ? ?Gen: A&Ox3, no distress  ?H&N: EOMI, atraumatic, neck supple ?Chest: unlabored respirations, RRR ?Abd: soft, minimally subjectively tender in the epigastrium, nontender in left upper quadrant, nondistended, incision(s) c/d/i without cellulitis or hematoma, no ecchymoses, no drainage ?Ext: warm, no edema ?Neuro: grossly normal ? ?Lines/tubes/drains: PIV ? 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  Additional I&O reporting order placed as we do need strict  urine output and p.o. intake recording for bariatric patients. ?-Transfer back to the bariatric floor ? ?We will continue to monitor his progress throughout the day.  If afternoon labs look better and he is doing well with liquids, potential discharge this evening ? ? ?Romana Juniper, MD FACS ?[facility] ? ?  ?

## 2021-11-29 NOTE — Progress Notes (Signed)
Patient seen in PACU after surgery.  Discussed QI "Goals for Discharge" document with patient including ambulation in halls, Incentive Spirometry use every hour, and oral care.  Also discussed pain and nausea control.  BSTOP education provided including BSTOP information guide, "Guide for Pain Management after your Bariatric Procedure".  Diet progression education provided including "Bariatric Surgery Post-Op Food Plan Phase 1: Liquids".  Questions answered.  Will continue to partner with bedside RN and follow up with patient per protocol after arrival to the floor.  

## 2021-11-29 NOTE — Progress Notes (Signed)
Reviewed Lovenox teaching kit and pt reviewed Lovenox "Patient-Self Injection Video".   ?

## 2021-11-29 NOTE — Discharge Instructions (Signed)
GASTRIC BYPASS / SLEEVE  ?Home Care Instructions ? ?These instructions are to help you care for yourself when you go home. ? ?Call: If you have any problems. ?Call [phone] and ask for the surgeon on call ?If you have an emergency related to your surgery please use the ER at [facility].  ?Tell the ER staff that you are a new post-op gastric bypass or gastric sleeve patient ?  ?Signs and symptoms to report: Severe vomiting or nausea ?If you cannot handle clear liquids for longer than 1 day, call your surgeon  ?Abdominal pain which does not get better after taking your pain medication ?Fever greater than 100.4? F and chills ?Heart rate over 100 beats a minute ?Trouble breathing ?Chest pain ? Redness, swelling, drainage, or foul odor at incision (surgical) sites ? If your incisions open or pull apart ?Swelling or pain in calf (lower leg) ?Diarrhea (Loose bowel movements that happen often), frequent watery, uncontrolled bowel movements ?Constipation, (no bowel movements for 3 days) if this happens:  ?Take Milk of Magnesia, 2 tablespoons by mouth, 3 times a day for 2 days if needed ?Stop taking Milk of Magnesia once you have had a bowel movement ?Call your doctor if constipation continues ?Or ?Take Miralax  (instead of Milk of Magnesia) following the label instructions ?Stop taking Miralax once you have had a bowel movement ?Call your doctor if constipation continues ?Anything you think is ?abnormal for you? ?  ?Normal side effects after surgery: Unable to sleep at night or unable to concentrate ?Irritability ?Being tearful (crying) or depressed ?These are common complaints, possibly related to your anesthesia, stress of surgery and change in lifestyle, that usually go away a few weeks after surgery.  If these feelings continue, call your medical doctor.  ?Wound Care: You may have surgical glue, steri-strips, or staples over your incisions after surgery ?Surgical glue:  Looks like a clear film over your incisions  and will wear off a little at a time ?Steri-strips : Adhesive strips of tape over your incisions. You may notice a yellowish color on the skin under the steri-strips. This is used to make the   steri-strips stick better. Do not pull the steri-strips off - let them fall off ?Staples: Staples may be removed before you leave the hospital ?If you go home with staples, call [facility] at for an appointment with your surgeon?s nurse to have staples removed 10 days after surgery, [phone] ?Showering: You may shower two (2) days after your surgery unless your surgeon tells you differently ?Wash gently around incisions with warm soapy water, rinse well, and gently pat dry  ?If you have a drain (tube from your incision), you may need someone to hold this while you shower  ?No tub baths until staples are removed and incisions are healed   ?  ?Medications: Medications should be liquid or crushed if larger than the size of a dime ?Extended release pills (medication that releases a little bit at a time through the day) should not be crushed ?Depending on the size and number of medications you take, you may need to space (take a few throughout the day)/change the time you take your medications so that you do not over-fill your pouch (smaller stomach) ?Make sure you follow-up with your primary care physician to make medication changes needed during rapid weight loss and life-style changes ?If you have diabetes, follow up with the doctor that orders your diabetes medication(s) within one week after surgery and check   your blood sugar regularly. ?Do not drive while taking narcotics (pain medications) ?DO NOT take NSAID'S (Examples of NSAID's include ibuprofen, naproxen)  ?Diet:                    First 2 Weeks ? You will see the nutritionist about two (2) weeks after your surgery. The nutritionist will increase the types of foods you can eat if you are handling liquids well: ?If you have severe vomiting or nausea  and cannot handle clear liquids lasting longer than 1 day, call your surgeon  ?Protein Shake ?Drink at least 2 ounces of shake 5-6 times per day ?Each serving of protein shakes (usually 8 - 12 ounces) should have a minimum of:  ?15 grams of protein  ?And no more than 5 grams of carbohydrate  ?Goal for protein each day: ?Men = 80 grams per day ?Women = 60 grams per day ?Protein powder may be added to fluids such as non-fat milk or Lactaid milk or Soy milk (limit to 35 grams added protein powder per serving) ? ?Hydration ?Slowly increase the amount of water and other clear liquids as tolerated (See Acceptable Fluids) ?Slowly increase the amount of protein shake as tolerated  ? Sip fluids slowly and throughout the day ?May use sugar substitutes in small amounts (no more than 6 - 8 packets per day; i.e. Splenda) ? ?Fluid Goal ?The first goal is to drink at least 8 ounces of protein shake/drink per day (or as directed by the nutritionist);  See handout from pre-op Bariatric Education Class for examples of protein shake/drink.   ?Slowly increase the amount of protein shake you drink as tolerated ?You may find it easier to slowly sip shakes throughout the day ?It is important to get your proteins in first ?Your fluid goal is to drink 64 - 100 ounces of fluid daily ?It may take a few weeks to build up to this ?32 oz (or more) should be clear liquids  ?And  ?32 oz (or more) should be full liquids (see below for examples) ?Liquids should not contain sugar, caffeine, or carbonation ? ?Clear Liquids: ?Water or Sugar-free flavored water (i.e. Fruit H2O, Propel) ?Decaffeinated coffee or tea (sugar-free) ?Brett Gibbs, Wyler?s Gibbs, Minute Maid Gibbs ?Sugar-free Jell-O ?Bouillon or broth ?Sugar-free Popsicle:   *Less than 20 calories each; Limit 1 per day ? ?Full Liquids: ?Protein Shakes/Drinks + 2 choices per day of other full liquids ?Full liquids must be: ?No More Than 12 grams of Carbs per serving  ?No More Than 3 grams of Fat  per serving ?Strained low-fat cream soup ?Non-Fat milk ?Fat-free Lactaid Milk ?Sugar-free yogurt (Dannon Gibbs & Fit, Greek yogurt) ? ? ? ?  ?Vitamins and Minerals Start 1 day after surgery unless otherwise directed by your surgeon ?Bariatric Specific Complete Multivitamins ?Chewable Calcium Citrate with Vitamin D-3 ?(Example: 3 Chewable Calcium Plus 600 with Vitamin D-3) ?Take 500 mg three (3) times a day for a total of 1500 mg each day ?Do not take all 3 doses of calcium at one time as it may cause constipation, and you can only absorb 500 mg  at a time  ?Do not mix multivitamins containing iron with calcium supplements; take 2 hours apart ? ?Menstruating women and those at risk for anemia (a blood disease that causes weakness) may need extra iron ?Talk with your doctor to see if you need more iron ?If you need extra iron: Total daily Iron recommendation (including Vitamins) is 50 to 100   mg Iron/day ?Do not stop taking or change any vitamins or minerals until you talk to your nutritionist or surgeon ?Your nutritionist and/or surgeon must approve all vitamin and mineral supplements ?  ?Activity and Exercise: It is important to continue walking at home.  Limit your physical activity as instructed by your doctor.  During this time, use these guidelines: ?Do not lift anything greater than ten (10) pounds for at least two (2) weeks ?Do not go back to work or drive until your surgeon says you can ?You may have sex when you feel comfortable  ?It is VERY important for male patients to use a reliable birth control method; fertility often increases after surgery  ?Do not get pregnant for at least 18 months ?Start exercising as soon as your doctor tells you that you can ?Make sure your doctor approves any physical activity ?Start with a simple walking program ?Walk 5-15 minutes each day, 7 days per week.  ?Slowly increase until you are walking 30-45 minutes per day ?Consider joining our BELT program. [phone] or email  [email or web address] ?  ?Special Instructions Things to remember: ? ?Use your CPAP when sleeping if this applies to you, do not stop the use of CPAP unless directed by physician after a sleep study ?[place]

## 2021-11-29 NOTE — Progress Notes (Signed)
Patient alert and oriented, pain is controlled. Patient is tolerating fluids, advanced to protein shake today, patient is tolerating well.  Reviewed Gastric sleeve discharge instructions with patient and patient is able to articulate understanding.  Provided information on BELT program, Support Group and [facility] outpatient pharmacy. All questions answered, will continue to monitor.  

## 2021-11-30 ENCOUNTER — Other Ambulatory Visit: Payer: Self-pay

## 2021-11-30 LAB — SURGICAL PATHOLOGY

## 2021-11-30 LAB — MAGNESIUM: Magnesium: 1.9 mg/dL (ref 1.7–2.4)

## 2021-11-30 LAB — BASIC METABOLIC PANEL
Anion gap: 4 — ABNORMAL LOW (ref 5–15)
BUN: 21 mg/dL (ref 8–23)
CO2: 24 mmol/L (ref 22–32)
Calcium: 9.3 mg/dL (ref 8.9–10.3)
Chloride: 113 mmol/L — ABNORMAL HIGH (ref 98–111)
Creatinine, Ser: 0.9 mg/dL (ref 0.61–1.24)
GFR, Estimated: >60 mL/min (ref >60)
Glucose, Bld: 93 mg/dL (ref 70–99)
Potassium: 4.6 mmol/L (ref 3.5–5.1)
Sodium: 141 mmol/L (ref 135–145)

## 2021-11-30 NOTE — Progress Notes (Signed)
24hr fluid recall prior to discharge: . Per dehydration protocol, will call pt to f/u within one week. ?

## 2021-11-30 NOTE — TOC Benefit Eligibility Note (Signed)
Transition of Care (TOC) Benefit Eligibility Note  ? ? ?Patient Details  ?Name: Brett Gibbs ?MRN: [id number] ?Date of Birth: 08-Nov-1955 ? ? ?Medication/Dose: Lovenox (enoxaparin) 40 mg SQ bid x 14 days ? ?Covered?: Yes ? ?Tier: Other ? ?Prescription Coverage [facility] [place] ? ?Spoke with Person/Company/Phone Number:: Joni/ [facility] [employer] [phone] ? ?Co-Pay: $20 ? ?Prior Approval: No ? ?Deductible: Met ? ?Additional Notes: Medication was ran yesterday and [facility] at [facility] and awaiting pick up ? ? ? ?Kerin Salen ?Phone Number: ?11/30/2021, 10:55 AM ? ? ? ? ?

## 2021-11-30 NOTE — Discharge Summary (Signed)
?Physician Discharge Summary  ?Brett Gibbs QTM:[id number] DOB: 1955/10/26 DOA: 11/28/2021 ? ?PCP: Mateo Flow, MD ? ?Admit date: 11/28/2021 ?Discharge date: 11/30/2021  ? ?Recommendations for Outpatient Follow-up:  ? ? Follow-up Information   ? ? Clovis Riley, MD. Daphane Shepherd on 12/23/2021.   ?Specialty: General Surgery ?Why: at 4:10pm. Please arrive 15 minutes prior to your appointment time.  Thank you. ?Contact information: ?[address] ?[address] ?[place] [address] ?[phone] ? ? ?  ?  ? ? Clovis Riley, MD. Daphane Shepherd on 01/18/2022.   ?Specialty: General Surgery ?Why: at 3pm.  Please arrive 15 minutes prior to your appointment time.  Thank you. ?Contact information: ?[address] ?[address] ?[place] [address] ?[phone] ? ? ?  ?  ? ?  ?  ? ?  ? ?Discharge Diagnoses:  ?Principal Problem: ?  Morbid obesity ([place]) ? ? ?Surgical Procedure: Laparoscopic Sleeve Gastrectomy, upper endoscopy ? ?Discharge Condition: Good ?Disposition: Home ? ?Diet recommendation: Postoperative sleeve gastrectomy diet (liquids only) ? ?Filed Weights  ? 11/28/21 1152  ?Weight: (!) 139.7 kg  ? ? ? ?Hospital Course:  ?The patient was admitted for a planned laparoscopic sleeve gastrectomy. Please see operative note. Preoperatively the patient was given 5000 units of subcutaneous heparin for DVT prophylaxis. Postoperative prophylactic Lovenox dosing was started on the evening of postoperative day 0. ERAS protocol was used. On the evening of postoperative day 0, the patient was started on water and ice chips. On postoperative day 1 the patient had no fever or tachycardia and was tolerating water in their diet was gradually advanced throughout the day. The patient was ambulating without difficulty. Their vital signs are stable without fever or tachycardia. Their hemoglobin had remained stable. He did have a mild AKI and mild hyperkalemia which resolved by POD 2. The patient had received discharge instructions and  counseling. They were deemed stable for discharge and had met discharge criteria ? ? ?Discharge Instructions ? ? ?Allergies as of 11/30/2021   ? ?   Reactions  ? Penicillins Hives, Itching  ? ?  ? ?  ?Medication List  ?  ? ?STOP taking these medications   ? ?amLODipine-valsartan 5-320 MG tablet ?Commonly known as: EXFORGE ?  ?aspirin EC 81 MG tablet ?  ?meloxicam 7.5 MG tablet ?Commonly known as: MOBIC ?  ? ?  ? ?TAKE these medications   ? ?acetaminophen 500 MG tablet ?Commonly known as: TYLENOL ?Take 2 tablets (1,000 mg total) by mouth every 8 (eight) hours for 5 days. ?  ?albuterol 108 (90 Base) MCG/ACT inhaler ?Commonly known as: VENTOLIN HFA ?Inhale 2 puffs into the lungs 2 (two) times daily as needed for wheezing or shortness of breath. ?  ?amLODipine 5 MG tablet ?Commonly known as: NORVASC ?Take 1 tablet (5 mg total) by mouth daily. Monitor your blood pressure daily at home and bring a log to your primary care provider.  Please follow-up with them to further titrate your blood pressure medications.  You may need to decrease or stop blood pressure medications very quickly after bariatric surgery. ?  ?enoxaparin 40 MG/0.4ML injection ?Commonly known as: LOVENOX ?Inject 0.4 mLs (40 mg total) into the skin every 12 (twelve) hours for 14 days. ?  ?gabapentin 100 MG capsule ?Commonly known as: NEURONTIN ?Take 2 capsules (200 mg total) by mouth every 12 (twelve) hours. ?  ?Glucosamine HCl 1500 MG Tabs ?Take 1,500 mg by mouth every morning. ?  ?levothyroxine 200 MCG tablet ?Commonly known as:  SYNTHROID ?Take 200 mcg by mouth every morning. ?  ?multivitamin with minerals Tabs tablet ?Take 1 tablet by mouth every morning. ?  ?ondansetron 4 MG disintegrating tablet ?Commonly known as: ZOFRAN-ODT ?Take 1 tablet (4 mg total) by mouth every 6 (six) hours as needed for nausea or vomiting. ?  ?pantoprazole 40 MG tablet ?Commonly known as: PROTONIX ?Take 1 tablet (40 mg total) by mouth daily. Take this medication daily  regardless of reflux symptoms ?  ?traMADol 50 MG tablet ?Commonly known as: ULTRAM ?Take 1 tablet (50 mg total) by mouth every 6 (six) hours as needed (pain). ?  ? ?  ? ? Follow-up Information   ? ? Clovis Riley, MD. Daphane Shepherd on 12/23/2021.   ?Specialty: General Surgery ?Why: at 4:10pm. Please arrive 15 minutes prior to your appointment time.  Thank you. ?Contact information: ?[address] ?[address] ?[place] [address] ?[phone] ? ? ?  ?  ? ? Clovis Riley, MD. Daphane Shepherd on 01/18/2022.   ?Specialty: General Surgery ?Why: at 3pm.  Please arrive 15 minutes prior to your appointment time.  Thank you. ?Contact information: ?[address] ?[address] ?[place] [address] ?[phone] ? ? ?  ?  ? ?  ?  ? ?  ? ? ? ?The results of significant diagnostics from this hospitalization (including imaging, microbiology, ancillary and laboratory) are listed below for reference.   ? ?Significant Diagnostic Studies: ?No results found. ? ?Labs: ?Basic Metabolic Panel: ?Recent Labs  ?Lab 11/29/21 ?0350 11/29/21 ?1427 11/30/21 ?0448  ?NA 138 139 141  ?K 5.5* 5.7* 4.6  ?CL 108 110 113*  ?CO2 18* 24 24  ?GLUCOSE 145* 122* 93  ?BUN 31* 27* 21  ?CREATININE 1.26* 0.96 0.90  ?CALCIUM 9.6 9.3 9.3  ?MG  --   --  1.9  ? ?Liver Function Tests: ?Recent Labs  ?Lab 11/29/21 ?0350  ?AST 31  ?ALT 41  ?ALKPHOS 46  ?BILITOT 0.9  ?PROT 6.7  ?ALBUMIN 3.9  ? ? ?CBC: ?Recent Labs  ?Lab 11/28/21 ?1908 11/29/21 ?0350  ?WBC 11.7* 9.0  ?NEUTROABS 10.7* 8.4*  ?HGB 15.8 15.2  ?HCT 46.8 47.0  ?MCV 93.6 98.7  ?PLT 157 141*  ? ? ?CBG: ?Recent Labs  ?Lab 11/29/21 ?1710  ?GLUCAP 115*  ? ? ?Principal Problem: ?  Morbid obesity ([place]) ? ? ?Signed: ? ?Clovis Riley  MD FACS ?[facility] [place] ?[phone] ?11/30/2021, 8:16 AM ? ?

## 2021-11-30 NOTE — Anesthesia Postprocedure Evaluation (Signed)
Anesthesia Post Note ? ?Patient: Brett Gibbs ? ?Procedure(s) Performed: LAPAROSCOPIC SLEEVE GASTRECTOMY ?UPPER GI ENDOSCOPY ? ?  ? ?Patient location during evaluation: PACU ?Anesthesia Type: General ?Level of consciousness: sedated and patient cooperative ?Pain management: pain level controlled ?Vital Signs Assessment: post-procedure vital signs reviewed and stable ?Respiratory status: spontaneous breathing ?Cardiovascular status: stable ?Anesthetic complications: no ? ? ?No notable events documented. ? ?Last Vitals:  ?Vitals:  ? 11/30/21 0624 11/30/21 1035  ?BP: 120/73 138/78  ?Pulse: 68 60  ?Resp: 16 18  ?Temp: 36.9 ?C 36.8 ?C  ?SpO2: 94% 94%  ?  ?Last Pain:  ?Vitals:  ? 11/30/21 1035  ?TempSrc: Oral  ?PainSc:   ? ? ?  ?  ?  ?  ?  ?  ? ?Lewie Loron ? ? ? ? ?

## 2021-12-02 ENCOUNTER — Telehealth (HOSPITAL_COMMUNITY): Payer: Self-pay | Admitting: *Deleted

## 2021-12-02 NOTE — Telephone Encounter (Signed)
1.  Tell me about your pain and pain management? ?Pt denies any pain. ? ?2.  Let's talk about fluid intake.  How much total fluid are you taking in? ?Pt states that he is getting in more than 64oz of fluid including protein shakes, bottled water and broth. Pt instructed to assess status and suggestions daily utilizing Hydration Action Plan on discharge folder and to call [facility] if in the "red zone".  ? ?3.  How much protein have you taken in the last 2 days? ?Pt states that he is working to meet goal of goal of 80g of protein today.  Pt has already consumed one protein shake.  Discussed plan for pt to meet protein goal today and every day. Pt plans to drink remainder of protein throughout the rest of the day to meet goal. ? ?4.  Have you had nausea?  Tell me about when have experienced nausea and what you did to help? ?Pt denies any current nausea. Pt states that when he has been nauseated that the medication helps to alleviate his symptoms.  Pt denies vomiting. ?  ?5.  Has the frequency or color changed with your urine? ?Pt states that he is urinating "fine" with no changes in frequency or urgency.   ?  ?6.  Tell me what your incisions look like? ?"Incisions look fine". Pt denies a fever, chills.  Pt states incisions are not swollen, open, or draining.  Pt encouraged to call [facility] if incisions change. ?  ?7.  Have you been passing gas? BM? ?Pt states that he is having BMs. Last BM 12/01/21.   ?  ?8.  If a problem or question were to arise who would you call?  Do you know contact numbers for [facility], [facility], and [facility]? ?Pt denies dehydration symptoms.  Pt can describe s/sx of dehydration.  Pt knows to call [facility] for surgical, [facility] for nutrition, and [facility] for non-urgent questions or concerns. ?  ?9.  How has the walking going? ?Pt states he is walking around and able to be active without difficulty. He states that he is wearing his FitBit and able to get at least 6,000 steps. ?  ?10. Are you still using your incentive spirometer?  If  so, how often? ?Pt states that he is doing the I.S q1h and reaching 2000.   Pt encouraged to continue to use incentive spirometer, at least 10x every hour while awake until he sees the surgeon. ? ?11.  How are your vitamins and calcium going?  How are you taking them? ?Pt states that he is taking his supplements and vitamins without difficulty. ? ?Patient states that he is taking his Lovenox injections q12h without difficulty or concern. ? ?Reminded patient that the first 30 days post-operatively are important for successful recovery.  Practice good hand hygiene, wearing a mask when appropriate (since optional in most places), and minimizing exposure to people who live outside of the home, especially if they are exhibiting any respiratory, GI, or illness-like symptoms.   ? ?

## 2021-12-13 ENCOUNTER — Encounter: Payer: Self-pay | Admitting: Skilled Nursing Facility1

## 2021-12-13 ENCOUNTER — Encounter: Payer: 59 | Attending: Surgery | Admitting: Skilled Nursing Facility1

## 2021-12-13 NOTE — Progress Notes (Signed)
2 Week Post-Operative Nutrition Class ?  ?Patient was seen on 12/13/2021 for Post-Operative Nutrition education at the [facility].  ?  ?Surgery date: 11/28/2021 ?Surgery type: Sleeve ?Start weight at [facility]: 330.5 ?Weight today: 298.1 ?  ?Body Composition Scale 12/13/2021  ?Current Body Weight 298.1  ?Total Body Fat %   ?Visceral Fat   ?Fat-Free Mass %   ? Total Body Water %   ?Muscle-Mass lbs   ?BMI   ?Body Fat Displacement   ?       Torso  lbs   ?       Left Leg  lbs   ?       Right Leg  lbs   ?       Left Arm  lbs   ?       Right Arm   lbs   ? ?Clinical  ?Medical hx: HTN, thyroid ?Medications: see list  ?Labs: LDL 130, calcium 10.3 ?Notable signs/symptoms: aches and pains ?Any previous deficiencies? No ?  ?The following the learning objectives were met by the patient during this course: ?Identifies Phase 3 (Soft, High Proteins) Dietary Goals and will begin from 2 weeks post-operatively to 2 months post-operatively ?Identifies appropriate sources of fluids and proteins  ?Identifies appropriate fat sources and healthy verses unhealthy fat types   ?States protein recommendations and appropriate sources post-operatively ?Identifies the need for appropriate texture modifications, mastication, and bite sizes when consuming solids ?Identifies appropriate fat consumption and sources ?Identifies appropriate multivitamin and calcium sources post-operatively ?Describes the need for physical activity post-operatively and will follow MD recommendations ?States when to call healthcare provider regarding medication questions or post-operative complications ?  ?Handouts given during class include: ?Phase 3A: Soft, High Protein Diet Handout ?Phase 3 High Protein Meals ?Healthy Fats ?  ?Follow-Up Plan: ?Patient will follow-up at [facility] in 6 weeks for 2 month post-op nutrition visit for diet advancement per MD.  ?

## 2021-12-19 ENCOUNTER — Telehealth: Payer: Self-pay | Admitting: Skilled Nursing Facility1

## 2021-12-19 NOTE — Telephone Encounter (Signed)
RD called pt to verify fluid intake once starting soft, solid proteins 2 week post-bariatric surgery.   Daily Fluid intake: Daily Protein intake: Bowel Habits:   Concerns/issues:    Left voicemail message 

## 2022-01-24 ENCOUNTER — Encounter: Payer: Self-pay | Admitting: Dietician

## 2022-01-24 ENCOUNTER — Encounter: Payer: 59 | Attending: Nephrology | Admitting: Dietician

## 2022-01-24 DIAGNOSIS — E669 Obesity, unspecified: Secondary | ICD-10-CM | POA: Insufficient documentation

## 2022-01-24 NOTE — Progress Notes (Signed)
Bariatric Nutrition Follow-Up Visit Medical Nutrition Therapy  Appt Start Time: 10:00   End Time: 10:45  NUTRITION ASSESSMENT  Anthropometrics  Surgery date: 11/28/2021 Surgery type: Sleeve Start weight at [facility]: 330.5 Weight today: 277.5   Body Composition Scale 12/13/2021 01/24/2022  Current Body Weight 298.1 277.5  Total Body Fat %  35.8  Visceral Fat  39  Fat-Free Mass %  64.1   Total Body Water %  45.1  Muscle-Mass lbs  52.3  BMI  39.8  Body Fat Displacement           Torso  lbs  61.7         Left Leg  lbs  12.3         Right Leg  lbs  12.3         Left Arm  lbs  6.1         Right Arm   lbs  6.1   Clinical  Medical hx: HTN, thyroid Medications: see list  Labs: LDL 130, calcium 10.3 Notable signs/symptoms: aches and pains Any previous deficiencies? No   Lifestyle & Dietary Hx  Pt states he has a hard time remembering the calcium supplement. Pt states he has tried an alarm on his phone. Pt states he has a large garden and is ready to be advanced to eating non-starchy vegetables. Pt states he works 12 hour shifts. 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    Estimated daily fluid intake: 64+ oz Estimated daily protein intake: 80 g Supplements: multi vitamin and calcium Current average weekly physical activity: works 12 hour shifts   24-Hr Dietary Recall First Meal: protein shake or yogurt or fried egg Snack: protein shake  Second Meal:  tuna pack Snack:  Third Meal: beef jerky or white fish or shrimp Snack: nuts Beverages: coffee, water  Post-Op Goals/ Signs/ Symptoms Using straws: no Drinking while eating: no Chewing/swallowing difficulties: no Changes in vision: no Changes to mood/headaches: no Hair loss/changes to skin/nails: no Difficulty focusing/concentrating: no Sweating: no Limb weakness: no Dizziness/lightheadedness:  no Palpitations: no  Carbonated/caffeinated beverages: no N/V/D/C/Gas: no Abdominal pain: no Dumping syndrome: no    NUTRITION DIAGNOSIS  Overweight/obesity ([place]-3.3) related to past poor dietary habits and physical inactivity as evidenced by completed bariatric surgery and following dietary guidelines for continued weight loss and healthy nutrition status.     NUTRITION INTERVENTION Nutrition counseling (C-1) and education (E-2) to facilitate bariatric surgery goals, including: Diet advancement to the next phase (phase 4) now including non-starchy vegetables The importance of consuming adequate calories as well as certain nutrients daily due to the body's need for essential vitamins, minerals, and fats The importance of daily physical activity and to reach a goal of at least 150 minutes of moderate to vigorous physical activity weekly (or as directed by their physician) due to benefits such as increased musculature and improved lab values The importance of intuitive eating specifically learning hunger-satiety cues and understanding the importance of learning a new body: The importance of mindful eating to avoid grazing behaviors  Discussed the body comp scale results and the importance to track lean muscle mass.  Handouts Provided Include  Phase 4 Food Plan  Learning Style & Readiness for Change Teaching method utilized: Visual & Auditory  Demonstrated degree of understanding via: Teach Back  Readiness Level: ready Barriers to learning/adherence to lifestyle change: Work  schedule  RD's Notes for Next Visit Assess adherence to pt chosen goals   MONITORING & EVALUATION Dietary intake, weekly physical activity, body weight.  Next Steps Patient is to follow-up in 3 months for 6 month post-op follow-up.

## 2022-05-11 ENCOUNTER — Encounter: Payer: 59 | Attending: Nephrology | Admitting: Dietician

## 2022-05-11 ENCOUNTER — Encounter: Payer: Self-pay | Admitting: Dietician

## 2022-05-11 DIAGNOSIS — E669 Obesity, unspecified: Secondary | ICD-10-CM | POA: Diagnosis present

## 2022-05-11 NOTE — Progress Notes (Signed)
Bariatric Nutrition Follow-Up Visit Medical Nutrition Therapy  Appt Start Time: 7:55  End Time:   NUTRITION ASSESSMENT   Surgery date: 11/28/2021 Surgery type: Sleeve  Anthropometrics   Start weight at [facility]: 330.5 Height: 70 in Weight today: 238.0   Body Composition Scale 12/13/2021 01/24/2022 02/08/2022  Current Body Weight 298.1 277.5 238.0  Total Body Fat %  35.8 30.9  Visceral Fat  39 22  Fat-Free Mass %  64.1 69.0   Total Body Water %  45.1 50.0  Muscle-Mass lbs  52.3 44.8  BMI  39.8 34.1  Body Fat Displacement            Torso  lbs  61.7          Left Leg  lbs  12.3          Right Leg  lbs  12.3          Left Arm  lbs  6.1          Right Arm   lbs  6.1    Clinical  Medical hx: HTN, thyroid Medications: see list  Labs: LDL 130, calcium 10.3 Notable signs/symptoms: aches and pains Any previous deficiencies? No   Lifestyle & Dietary Hx  Pt states a lot is going on, and always busy, stating he is very active with work and projects at home. Pt states his blood pressure is running about 116/70, stating he hope his PCP will take him off his blood pressure medication when he sees them next month. Pt states carrots are not tolerated very well, stating also red meat bothers his stomach as well. Pt states he forgets to take the calcium supplement, stating when he is at work it will be able to remember to take it.   Estimated daily fluid intake: 64-100 oz Estimated daily protein intake: 80+ g Supplements: multi vitamin and calcium Current average weekly physical activity: works 12 hour shifts   24-Hr Dietary Recall First Meal: 5 am egg, coffee or protein shake and yogurt Snack:   Second Meal:  tuna pack, beef jerky Snack: protein shake Third Meal: baked fish and broccoli Snack: peanuts Beverages: coffee, water  Post-Op Goals/ Signs/ Symptoms Using straws: no Drinking while eating: no Chewing/swallowing difficulties: no Changes in vision: no Changes to  mood/headaches: no Hair loss/changes to skin/nails: no Difficulty focusing/concentrating: no Sweating: no Limb weakness: no Dizziness/lightheadedness: no Palpitations: no  Carbonated/caffeinated beverages: no N/V/D/C/Gas: no Abdominal pain: no Dumping syndrome: no    NUTRITION DIAGNOSIS  Overweight/obesity ([place]-3.3) related to past poor dietary habits and physical inactivity as evidenced by completed bariatric surgery and following dietary guidelines for continued weight loss and healthy nutrition status.     NUTRITION INTERVENTION Nutrition counseling (C-1) and education (E-2) to facilitate bariatric surgery goals, including: Diet advancement to the next phase (phase 5) now including starchy vegetables The importance of consuming adequate calories as well as certain nutrients daily due to the body's need for essential vitamins, minerals, and fats The importance of daily physical activity and to reach a goal of at least 150 minutes of moderate to vigorous physical activity weekly (or as directed by their physician) due to benefits such as increased musculature and improved lab values The importance of intuitive eating specifically learning hunger-satiety cues and understanding the importance of learning a new body: The importance of mindful eating to avoid grazing behaviors  Discussed the body comp scale results and the importance to track lean muscle mass.  Handouts Provided Include  Phase  4 Food Plan  Learning Style & Readiness for Change Teaching method utilized: Visual & Auditory  Demonstrated degree of understanding via: Teach Back  Readiness Level: ready Barriers to learning/adherence to lifestyle change: Work schedule  RD's Notes for Next Visit Assess adherence to pt chosen goals   MONITORING & EVALUATION Dietary intake, weekly physical activity, body weight.  Next Steps Patient is to follow-up in December for 9 month post-op follow-up.

## 2022-07-24 ENCOUNTER — Encounter: Payer: 59 | Admitting: Dietician

## 2023-05-20 IMAGING — RF DG UGI W SINGLE CM
11 of 13 series · 12 of 24 positions shown · non-contrast
Comparison: Abdomen radiograph 10/04/2006

CLINICAL DATA: Morbid obesity, preoperative for bariatric surgery.

EXAM:
UPPER GI SERIES WITH KUB
TECHNIQUE: After obtaining a scout radiograph a routine upper GI series was
performed using thin barium
FLUOROSCOPY TIME:  Fluoroscopy Time:  2 minutes, 48 seconds
Radiation Exposure Index (if provided by the fluoroscopic device):
80.2 mGy
Number of Acquired Spot Images: 4

[Series 3: cp_standard · 0.34mm/px · 1 of 21 frames shown (1 of 10)]
[frame 1/21]
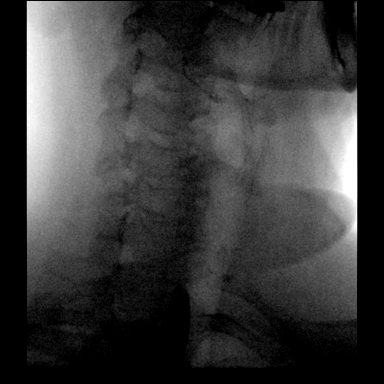

[Series 4: cp_standard · 0.34mm/px · 1 of 29 frames shown (2 of 10)]
[frame 4/29]
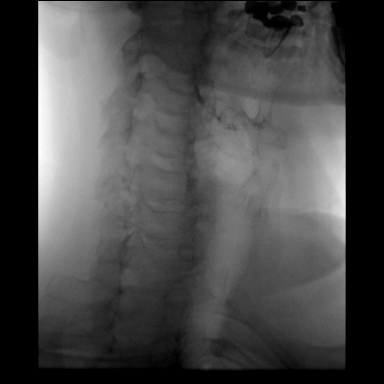

[Series 5: cp_standard · 0.34mm/px · 1 of 56 frames shown (3 of 10)]
[frame 9/56]
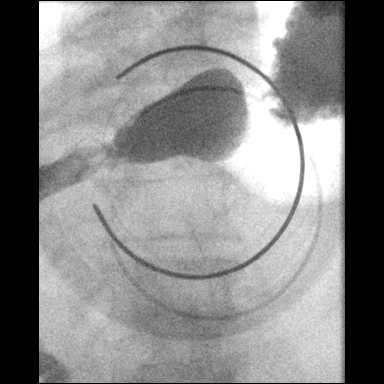

[Series 7: fluoro_barium 2fps_bw · 0.17mm/px · 1 of 1 slices shown]
[im 1/1]
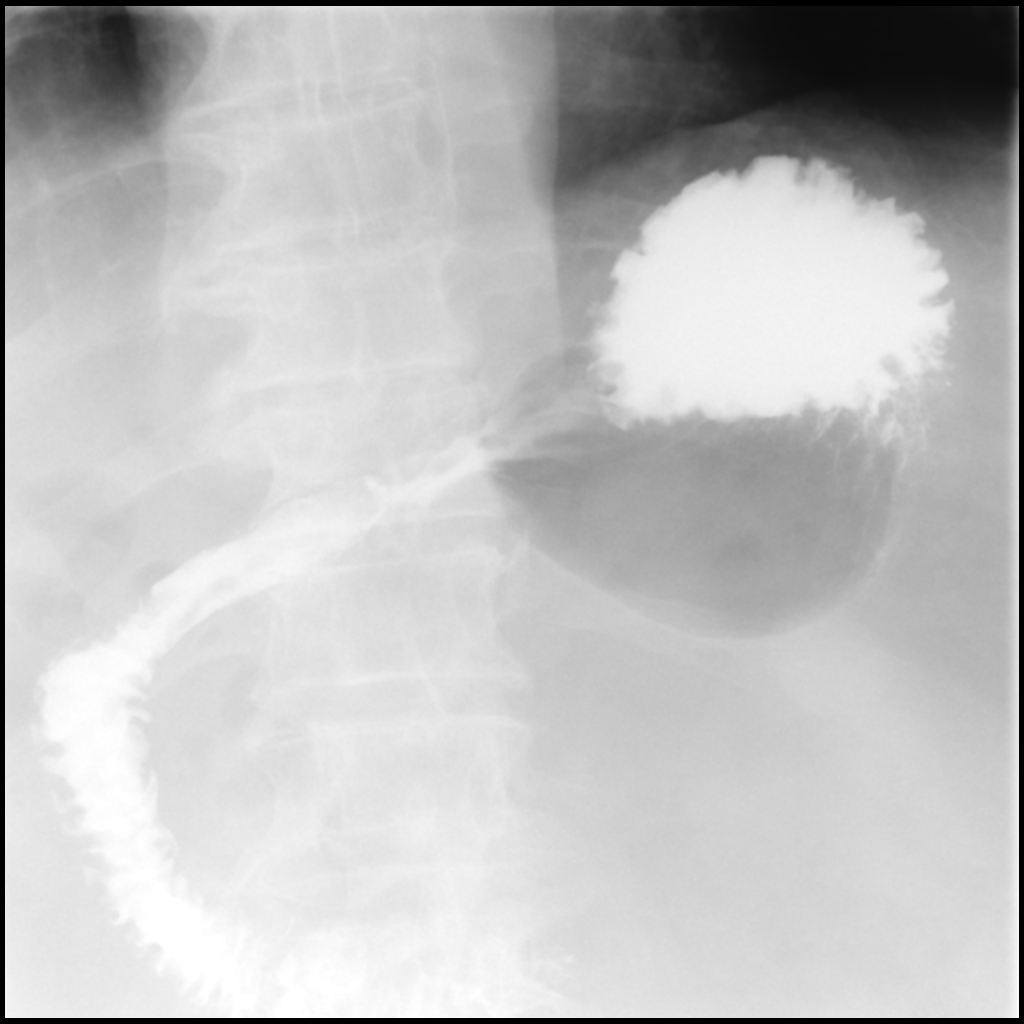

[Series 11: cp_standard · 0.34mm/px · 1 of 44 frames shown (4 of 10)]
[frame 2/44]
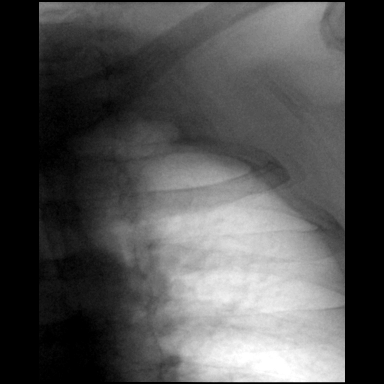

[Series 12: cp_standard · 0.35mm/px · 2 of 43 frames shown (5 of 10)]
[frame 7/43]
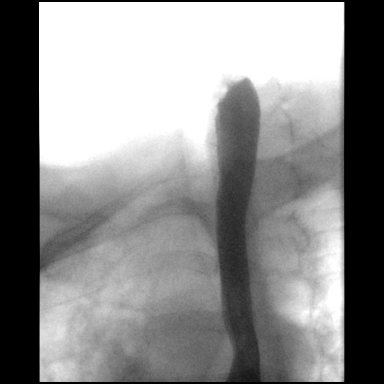
[frame 41/43]
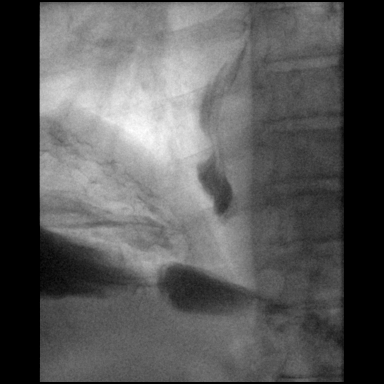

[Series 13: cp_standard · 0.35mm/px · 1 of 34 frames shown (6 of 10)]
[frame 29/34]
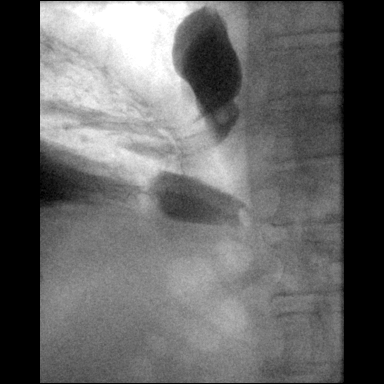

[Series 14: cp_standard · 0.35mm/px · 1 of 40 frames shown (7 of 10)]
[frame 35/40]
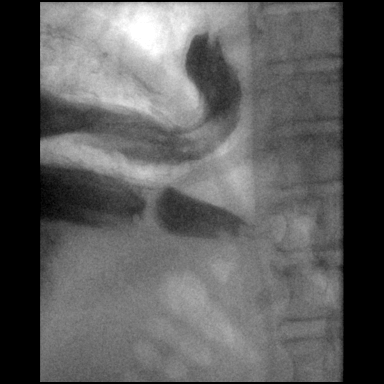

[Series 15: cp_standard · 0.35mm/px · 1 of 43 frames shown (8 of 10)]
[frame 38/43]
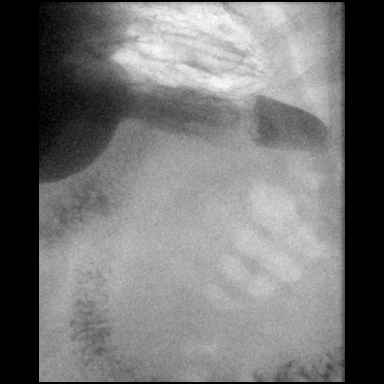

[Series 16: cp_standard · 0.34mm/px · 1 of 10 frames shown (9 of 10)]
[frame 10/10]
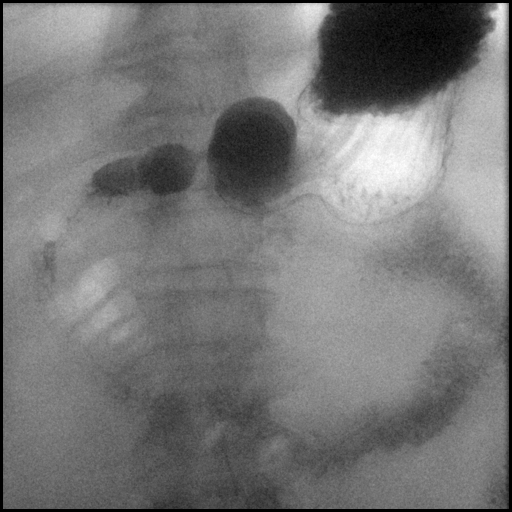

[Series 17: cp_standard · 0.35mm/px · 1 of 15 frames shown (10 of 10)]
[frame 15/15]
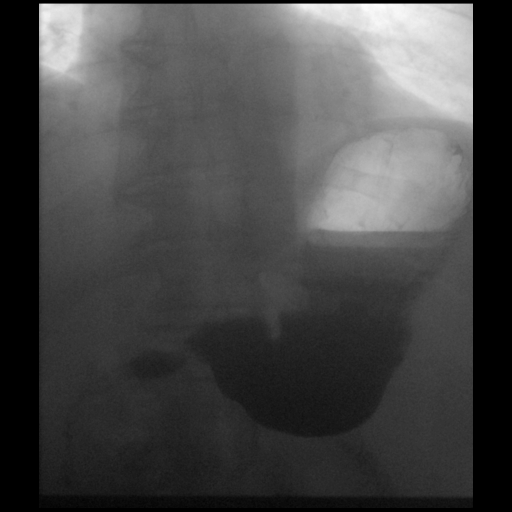

[12 of 24 positions shown; findings below may reference images not displayed]

FINDINGS: Initial KUB demonstrates an unremarkable bowel gas pattern. Left
total hip prosthesis noted. Moderate degenerative chondral thinning
in the right hip. Lower thoracic and lumbar spondylosis.

Primary peristaltic waves were normal on [DATE] swallows although there
was some mild proximal escape on [DATE] swallows. Mild distal
esophageal fold thickening can sometimes be associated with
esophagitis. No esophageal ulceration is identified. No esophageal
stricture identified.

Unremarkable gastric and duodenal morphology. Normal expected
position of the fourth portion of the duodenum.

A 13 mm barium tablet passed without difficulty into the stomach.
IMPRESSION: 1. Mild distal esophageal fold thickening can sometimes be
associated with esophagitis. No ulceration identified.
2. Otherwise normal exam.

## 2023-06-29 ENCOUNTER — Encounter (HOSPITAL_COMMUNITY): Payer: Self-pay | Admitting: *Deleted
